# Patient Record
Sex: Male | Born: 2000 | Race: White | Hispanic: No | Marital: Single | Smoking: Current every day smoker
Health system: Southern US, Community
[De-identification: ages and names within clinical notes are randomized; demographics above are authoritative.]

---
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
## 2018-02-02 ENCOUNTER — Other Ambulatory Visit: Payer: Self-pay

## 2018-02-02 ENCOUNTER — Encounter (HOSPITAL_COMMUNITY): Payer: Self-pay | Admitting: Emergency Medicine

## 2018-02-02 ENCOUNTER — Emergency Department (HOSPITAL_COMMUNITY)
Admission: EM | Admit: 2018-02-02 | Discharge: 2018-02-03 | Disposition: A | Discharge status: Home / Self Care | Payer: Self-pay | Attending: Emergency Medicine | Admitting: Emergency Medicine

## 2018-02-02 DIAGNOSIS — R1011 Right upper quadrant pain: Secondary | ICD-10-CM | POA: Insufficient documentation

## 2018-02-02 DIAGNOSIS — Z79899 Other long term (current) drug therapy: Secondary | ICD-10-CM | POA: Insufficient documentation

## 2018-02-02 DIAGNOSIS — F17210 Nicotine dependence, cigarettes, uncomplicated: Secondary | ICD-10-CM | POA: Insufficient documentation

## 2018-02-02 DIAGNOSIS — R109 Unspecified abdominal pain: Secondary | ICD-10-CM

## 2018-02-02 LAB — LIPASE, BLOOD: Lipase: 27 U/L (ref 11–51)

## 2018-02-02 LAB — COMPREHENSIVE METABOLIC PANEL
ALBUMIN: 4.3 g/dL (ref 3.5–5.0)
ALT: 18 U/L (ref 0–44)
ANION GAP: 8 (ref 5–15)
AST: 23 U/L (ref 15–41)
Alkaline Phosphatase: 69 U/L (ref 38–126)
BILIRUBIN TOTAL: 1.2 mg/dL (ref 0.3–1.2)
BUN: 14 mg/dL (ref 6–20)
CO2: 23 mmol/L (ref 22–32)
Calcium: 9.6 mg/dL (ref 8.9–10.3)
Chloride: 102 mmol/L (ref 98–111)
Creatinine, Ser: 1.04 mg/dL (ref 0.61–1.24)
GFR calc Af Amer: >60 mL/min (ref >60)
GFR calc non Af Amer: >60 mL/min (ref >60)
GLUCOSE: 102 mg/dL — AB (ref 70–99)
POTASSIUM: 3.9 mmol/L (ref 3.5–5.1)
Sodium: 133 mmol/L — ABNORMAL LOW (ref 135–145)
TOTAL PROTEIN: 7.4 g/dL (ref 6.5–8.1)

## 2018-02-02 LAB — CBC
HEMATOCRIT: 49.4 % (ref 39.0–52.0)
HEMOGLOBIN: 17.4 g/dL — AB (ref 13.0–17.0)
MCH: 30.2 pg (ref 26.0–34.0)
MCHC: 35.2 g/dL (ref 30.0–36.0)
MCV: 85.8 fL (ref 80.0–100.0)
Platelets: 415 10*3/uL — ABNORMAL HIGH (ref 150–400)
RBC: 5.76 MIL/uL (ref 4.22–5.81)
RDW: 12.2 % (ref 11.5–15.5)
WBC: 8.7 10*3/uL (ref 4.0–10.5)
nRBC: 0 % (ref 0.0–0.2)

## 2018-02-02 NOTE — ED Provider Notes (Signed)
[facility] EMERGENCY DEPARTMENT Provider Note   CSN: [id number] Arrival date & time: 02/02/18  2137     History   Chief Complaint Chief Complaint  Patient presents with  . Abdominal Pain    HPI Jorge Santos is a 17 y.o. male.  The history is provided by the patient.  He has history of anxiety disorder and comes in complaining of abdominal pain.  Pain started yesterday morning and was located in the right upper quadrant.  Pain is relatively mild initially and has been getting worse.  Pain is currently rated at 8/10.  Pain has moved to involve the right lower quadrant and also into the suprapubic area.  Pain is worse with palpation and breathing.  It is better if he flexes at the hips.  There is been anorexia but he denies nausea or vomiting.  He feels he is constipated and last bowel movement was yesterday.  He denies fever, chills, sweats.  He has not taken anything for pain.  History reviewed. No pertinent past medical history.  Patient Active Problem List   Diagnosis Date Noted  . Respiratory distress   . SOB (shortness of breath)   . Encounter for orogastric tube placement   . Hypoxia   . Anxiety disorder 10/24/2017  . Tobacco abuse   . Community acquired pneumonia 10/18/2017    History reviewed. No pertinent surgical history.      Home Medications    Prior to Admission medications   Medication Sig Start Date End Date Taking? Authorizing Provider  acetaminophen (TYLENOL) 500 MG tablet Take 1,000 mg by mouth every 6 (six) hours as needed for mild pain.    [provider]  busPIRone (BUSPAR) 5 MG tablet Take 1 tablet (5 mg total) by mouth 2 (two) times daily. 10/26/17   Georgette Shell, MD  citalopram (CELEXA) 20 MG tablet Take 1 tablet (20 mg total) by mouth daily. 10/27/17   Georgette Shell, MD  nicotine (NICODERM CQ - DOSED IN MG/24 HR) 7 mg/24hr patch Place 1 patch (7 mg total) onto the skin daily. Patient not taking:  Reported on 10/28/2017 10/26/17   Georgette Shell, MD    Family History History reviewed. No pertinent family history.  Social History Social History   Tobacco Use  . Smoking status: Current Every Day Smoker    Packs/day: 0.50    Years: 4.00    Pack years: 2.00    Types: Cigarettes  . Smokeless tobacco: Former Systems developer    Types: Snuff  Substance Use Topics  . Alcohol use: No  . Drug use: Yes    Types: Marijuana    Comment:  Smokes about 5-6 times per week as of October 18, 2017     Allergies   Penicillins   Review of Systems Review of Systems  All other systems reviewed and are negative.    Physical Exam Updated Vital Signs BP 136/86 (BP Location: Right Arm)   Pulse 99   Temp 98.7 F (37.1 C) (Oral)   Resp 16   Ht 5\' 6"  (1.676 m)   Wt 65.8 kg   SpO2 99%   BMI 23.40 kg/m   Physical Exam  Nursing note and vitals reviewed.  17 year old male, resting comfortably and in no acute distress. Vital signs are normal. Oxygen saturation is 99%, which is normal. Head is normocephalic and atraumatic. PERRLA, EOMI. Oropharynx is clear. Neck is nontender and supple without adenopathy or JVD. Back is nontender and  there is no CVA tenderness. Lungs are clear without rales, wheezes, or rhonchi. Chest is nontender. Heart has regular rate and rhythm without murmur. Abdomen is soft, flat, With mild tenderness in the right lower quadrant and suprapubic area. There are no masses or hepatosplenomegaly and peristalsis is hypoactive. Extremities have no cyanosis or edema, full range of motion is present. Skin is warm and dry without rash. Neurologic: Mental status is normal, cranial nerves are intact, there are no motor or sensory deficits.  ED Treatments / Results  Labs (all labs ordered are listed, but only abnormal results are displayed) Labs Reviewed  COMPREHENSIVE METABOLIC PANEL - Abnormal; Notable for the following components:      Result Value   Sodium 133 (*)     Glucose, Bld 102 (*)    All other components within normal limits  CBC - Abnormal; Notable for the following components:   Hemoglobin 17.4 (*)    Platelets 415 (*)    All other components within normal limits  URINALYSIS, ROUTINE W REFLEX MICROSCOPIC - Abnormal; Notable for the following components:   Color, Urine STRAW (*)    Specific Gravity, Urine 1.003 (*)    All other components within normal limits  LIPASE, BLOOD   Radiology Ct Abdomen Pelvis W Contrast  Result Date: 02/03/2018 CLINICAL DATA:  16 year old male with right-sided abdominal pain. EXAM: CT ABDOMEN AND PELVIS WITH CONTRAST TECHNIQUE: Multidetector CT imaging of the abdomen and pelvis was performed using the standard protocol following bolus administration of intravenous contrast. CONTRAST:  146mL OMNIPAQUE IOHEXOL 300 MG/ML  SOLN COMPARISON:  CT of the abdomen pelvis dated 10/18/2017 FINDINGS: Lower chest: The visualized lung bases are clear. No intra-abdominal free air or free fluid. Hepatobiliary: No focal liver abnormality is seen. No gallstones, gallbladder wall thickening, or biliary dilatation. Pancreas: Unremarkable. No pancreatic ductal dilatation or surrounding inflammatory changes. Spleen: Normal in size without focal abnormality. Adrenals/Urinary Tract: Adrenal glands are unremarkable. Kidneys are normal, without renal calculi, focal lesion, or hydronephrosis. Bladder is unremarkable. Stomach/Bowel: There is moderate stool throughout the colon. No bowel obstruction or active inflammation. Normal appendix. Vascular/Lymphatic: No significant vascular findings are present. No enlarged abdominal or pelvic lymph nodes. Reproductive: The prostate and seminal vesicles are grossly unremarkable. No pelvic mass. Other: None Musculoskeletal: No acute or significant osseous findings. IMPRESSION: No acute intra-abdominal or pelvic pathology. No bowel obstruction or active inflammation. Normal appendix. Electronically Signed   By: Anner Crete M.D.   On: 02/03/2018 01:23    Procedures Procedures   Medications Ordered in ED Medications  dicyclomine (BENTYL) capsule 20 mg (has no administration in time range)  sodium chloride 0.9 % bolus 1,000 mL (0 mLs Intravenous Stopped 02/03/18 0127)  morphine 4 MG/ML injection 4 mg (4 mg Intravenous Given 02/03/18 0034)  iohexol (OMNIPAQUE) 300 MG/ML solution 100 mL (100 mLs Intravenous Contrast Given 02/03/18 0042)     Initial Impression / Assessment and Plan / ED Course  I have reviewed the triage vital signs and the nursing notes.  Pertinent labs & imaging results that were available during my care of the patient were reviewed by me and considered in my medical decision making (see chart for details).  Abdominal pain which seems benign on exam.  However, I am concerned that pain started in the upper abdomen is migrated to the right lower abdomen.  He will be sent for CT scan to rule out appendicitis.  Of note, screening labs are essentially normal including normal WBC.  Old  records are reviewed, and he has seen his primary care provider for GI complaints in the past.  CT is unremarkable.  Suspect underlying irritable bowel syndrome.  He is discharged with prescription for dicyclomine.  Return precautions discussed.  Final Clinical Impressions(s) / ED Diagnoses   Final diagnoses:  Abdominal pain, unspecified abdominal location    ED Discharge Orders         Ordered    dicyclomine (BENTYL) 20 MG tablet  3 times daily before meals & bedtime     02/03/18 0813           Delora Fuel, MD 88/71/95 [phone]

## 2018-02-02 NOTE — ED Triage Notes (Signed)
Pt reports RUQ pain that started yesterday and is now spread to his RLQ. Denies N/V/D

## 2018-02-03 ENCOUNTER — Emergency Department (HOSPITAL_COMMUNITY): Payer: Self-pay

## 2018-02-03 LAB — URINALYSIS, ROUTINE W REFLEX MICROSCOPIC
Bilirubin Urine: NEGATIVE
Glucose, UA: NEGATIVE mg/dL
Hgb urine dipstick: NEGATIVE
Ketones, ur: NEGATIVE mg/dL
LEUKOCYTES UA: NEGATIVE
NITRITE: NEGATIVE
Protein, ur: NEGATIVE mg/dL
SPECIFIC GRAVITY, URINE: 1.003 — AB (ref 1.005–1.030)
pH: 6 (ref 5.0–8.0)

## 2018-02-03 MED ORDER — DICYCLOMINE HCL 10 MG PO CAPS: 20.0000 mg | ORAL_CAPSULE | Freq: Once | ORAL | Status: DC

## 2018-02-03 MED ORDER — IOHEXOL 300 MG/ML  SOLN
100.0000 mL | Freq: Once | INTRAMUSCULAR | Status: AC | PRN
  Administered 2018-02-03: 100 mL via INTRAVENOUS

## 2018-02-03 MED ORDER — MORPHINE SULFATE (PF) 4 MG/ML IV SOLN
4.0000 mg | Freq: Once | INTRAVENOUS | Status: AC
  Administered 2018-02-03: 4 mg via INTRAVENOUS
  Filled 2018-02-03: qty 1

## 2018-02-03 MED ORDER — DICYCLOMINE HCL 20 MG PO TABS: 20.0000 mg | ORAL_TABLET | Freq: Three times a day (TID) | ORAL | 0 refills | Status: DC

## 2018-02-03 MED ORDER — SODIUM CHLORIDE 0.9 % IV BOLUS
1000.0000 mL | Freq: Once | INTRAVENOUS | Status: AC
  Administered 2018-02-03: 1000 mL via INTRAVENOUS

## 2018-02-03 NOTE — ED Notes (Signed)
pts pain is bettert

## 2018-02-03 NOTE — Discharge Instructions (Addendum)
Return if symptoms are getting worse. °

## 2018-02-03 NOTE — ED Notes (Signed)
To ct

## 2019-08-05 IMAGING — CT CT CHEST W/O CM
2 of 4 series · 15 of 36 positions shown, 18 images · non-contrast
Comparison: CT abdomen and pelvis October 18, 2017 and chest
radiograph October 18, 2017

CLINICAL DATA: Dyspnea for 5 days. Abdominal pain and vomiting. No
appetite. Follow up pneumonia.

EXAM:
CT CHEST WITHOUT CONTRAST
TECHNIQUE: Multidetector CT imaging of the chest was performed following the
standard protocol without IV contrast.

[Series 2: thorax · axial · 0.75mm/px · z∈[-355,-95]mm · 12 of 152 slices shown, 15 images]
[im 11/152  mediastinal]
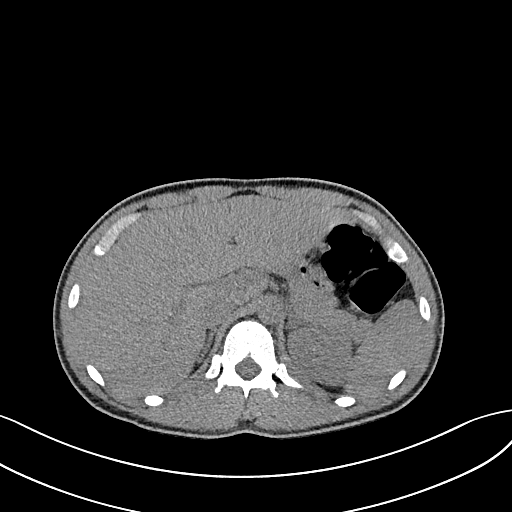
[im 11/152  lung]
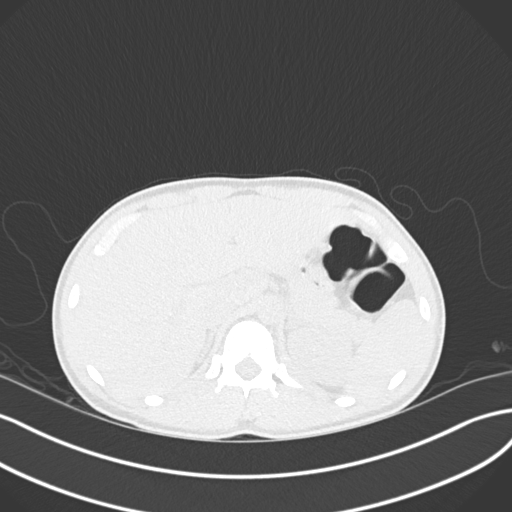
[im 22/152  lung]
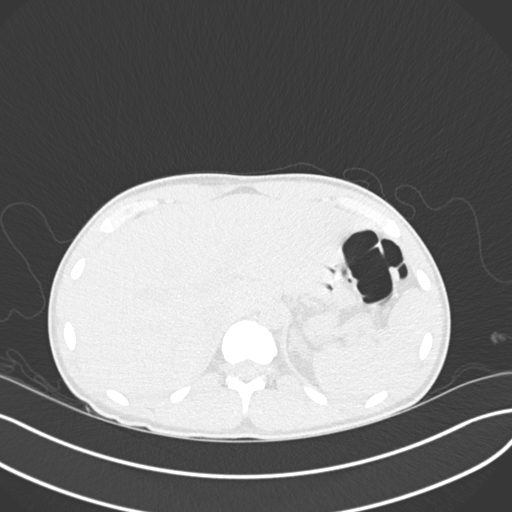
[im 33/152  lung]
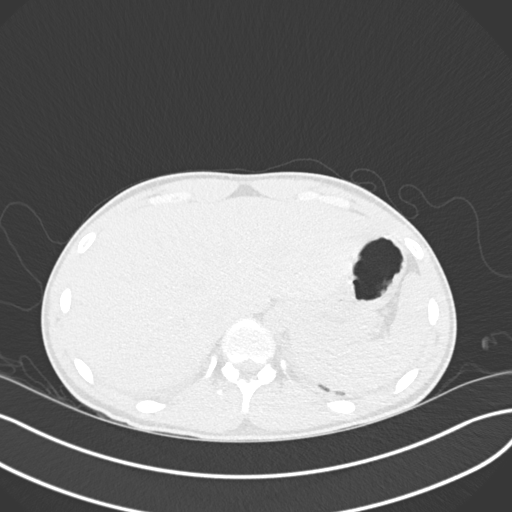
[im 44/152  lung]
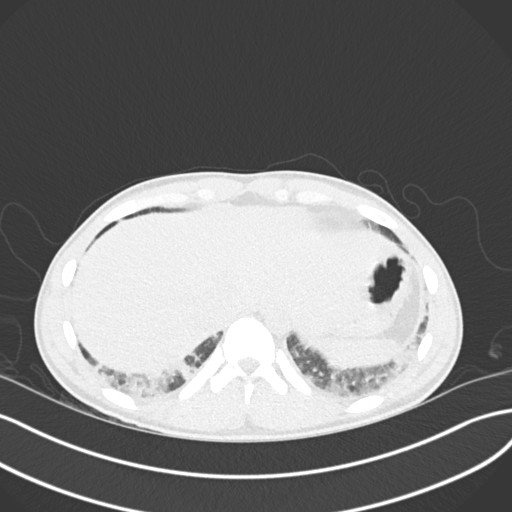
[im 54/152  mediastinal]
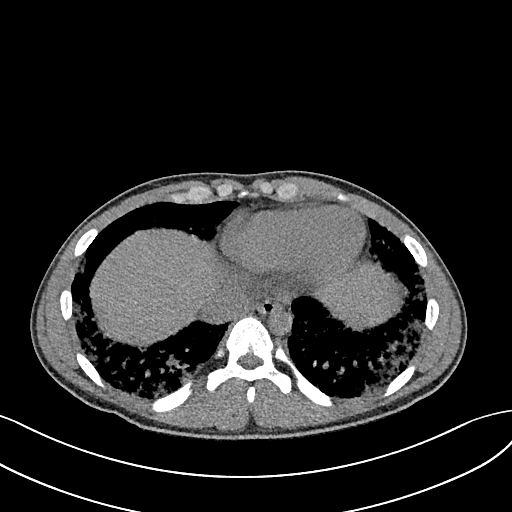
[im 54/152  lung]
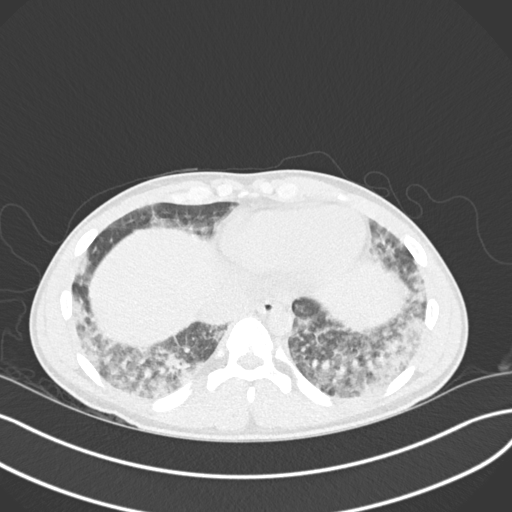
[im 65/152  lung]
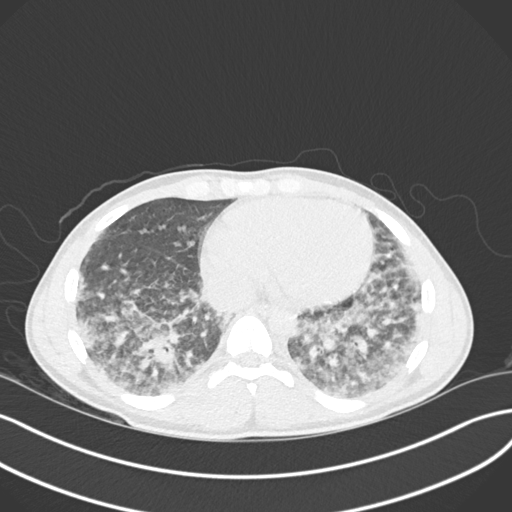
[im 87/152  lung]
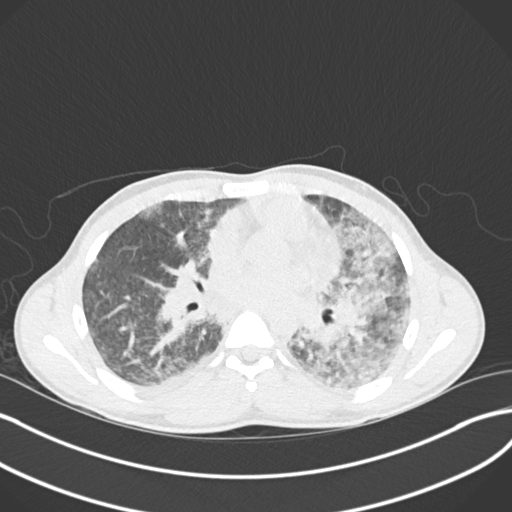
[im 98/152  lung]
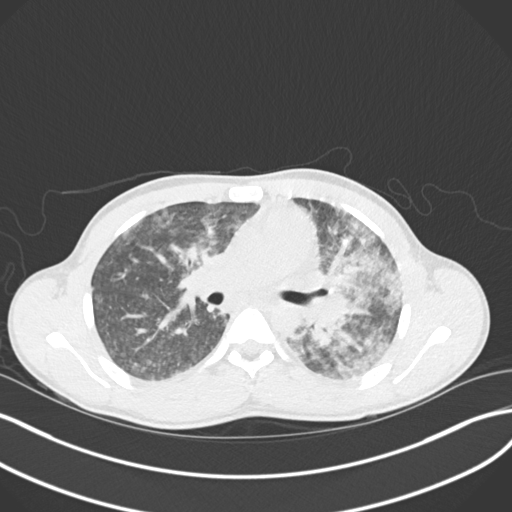
[im 108/152  mediastinal]
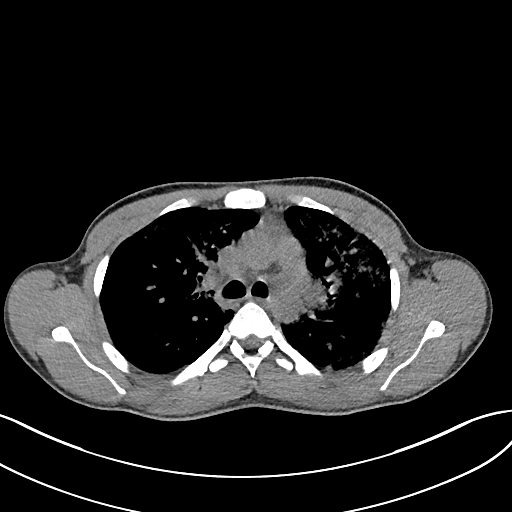
[im 108/152  lung]
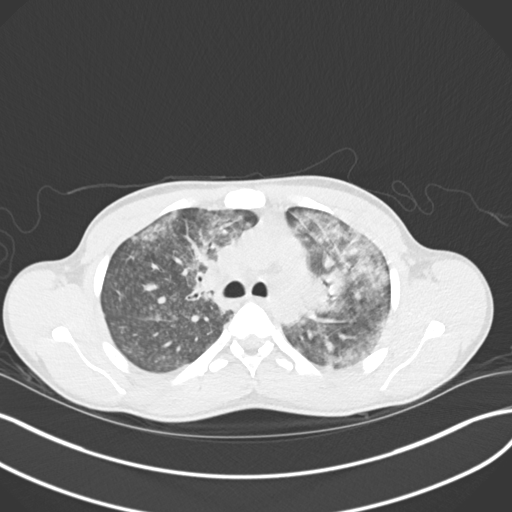
[im 119/152  lung]
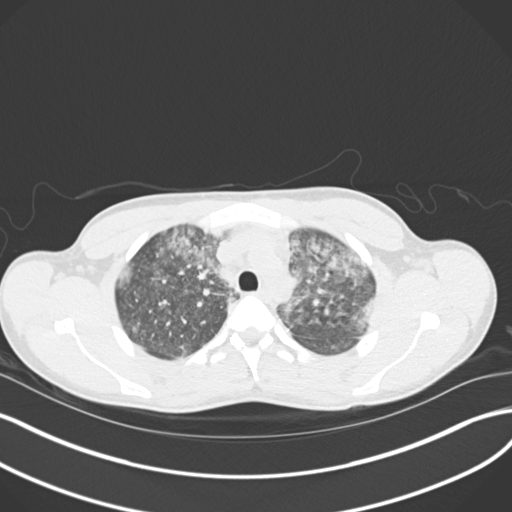
[im 130/152  lung]
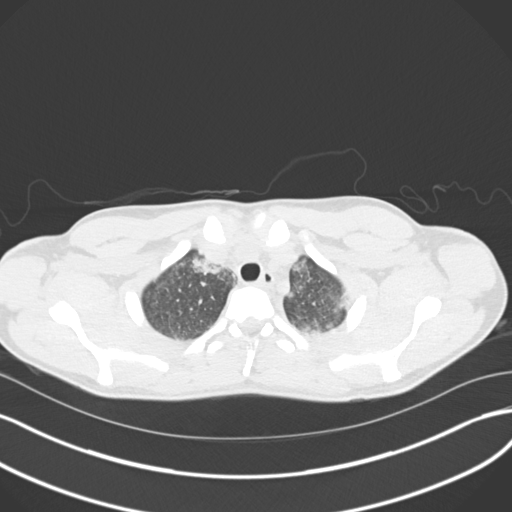
[im 141/152  lung]
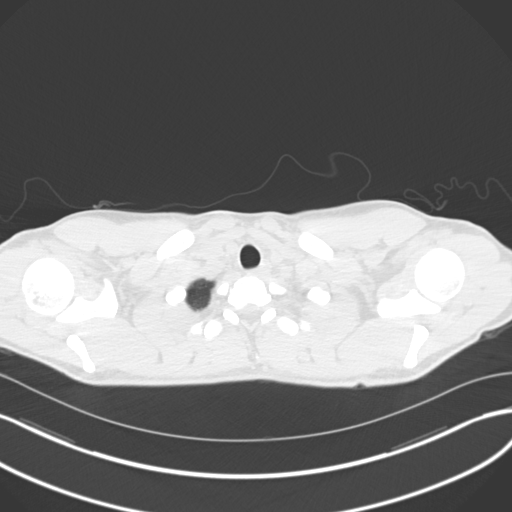

[Series 5: coronal · coronal · 0.66mm/px · 3 of 93 slices shown]
[im 19/93  lung]
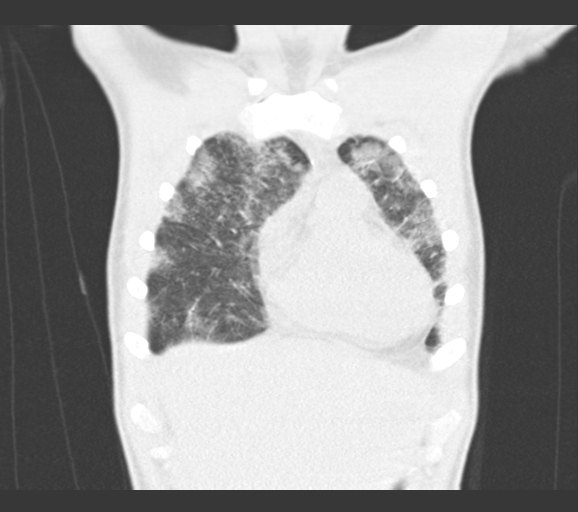
[im 37/93  lung]
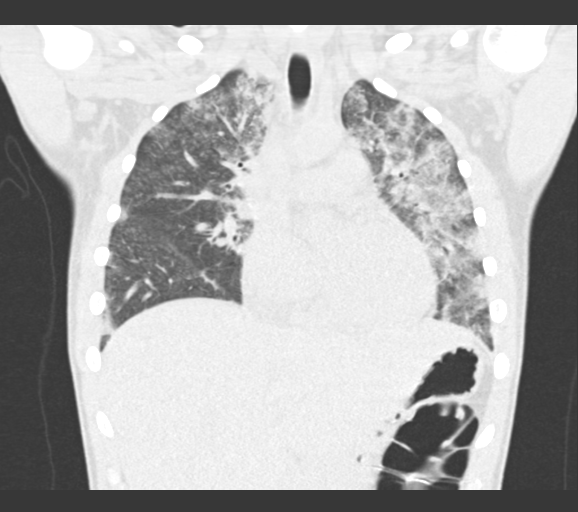
[im 56/93  lung]
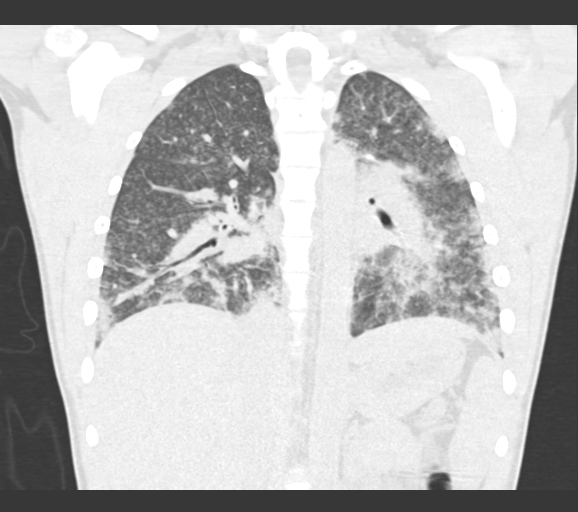

[15 of 36 positions shown; findings below may reference images not displayed]

FINDINGS: CARDIOVASCULAR: Heart and pericardium are unremarkable. Thoracic
aorta is normal course and caliber, unremarkable.

MEDIASTINUM/NODES: No mediastinal mass. Probable hilar
lymphadenopathy, decreased sensitivity without contrast. 7 mm
aortopulmonary window lymph node.

LUNGS/PLEURA: Tracheobronchial tree is patent, no pneumothorax.
Diffuse bronchial wall thickening. Extensive central lobular
ground-glass nodules with multifocal consolidation throughout the
lungs. No pleural effusion.

UPPER ABDOMEN: Nonacute.  Partially imaged contrast in the kidneys.

MUSCULOSKELETAL: Nonacute.
IMPRESSION: 1. Severe diffuse multifocal bronchopneumonia.
2. Suspected hilar lymphadenopathy, decreased sensitivity without
contrast.

## 2019-08-06 IMAGING — DX DG CHEST 1V PORT
1 series · 1 of 1 positions shown · non-contrast
Comparison: CT chest 10/18/2017. Chest x-rays 10/18/2017,
10/16/2017, 10/01/2015.

CLINICAL DATA: Respiratory distress. Follow-up BILATERAL pneumonia.

EXAM:
PORTABLE CHEST 1 VIEW

[chest ap]
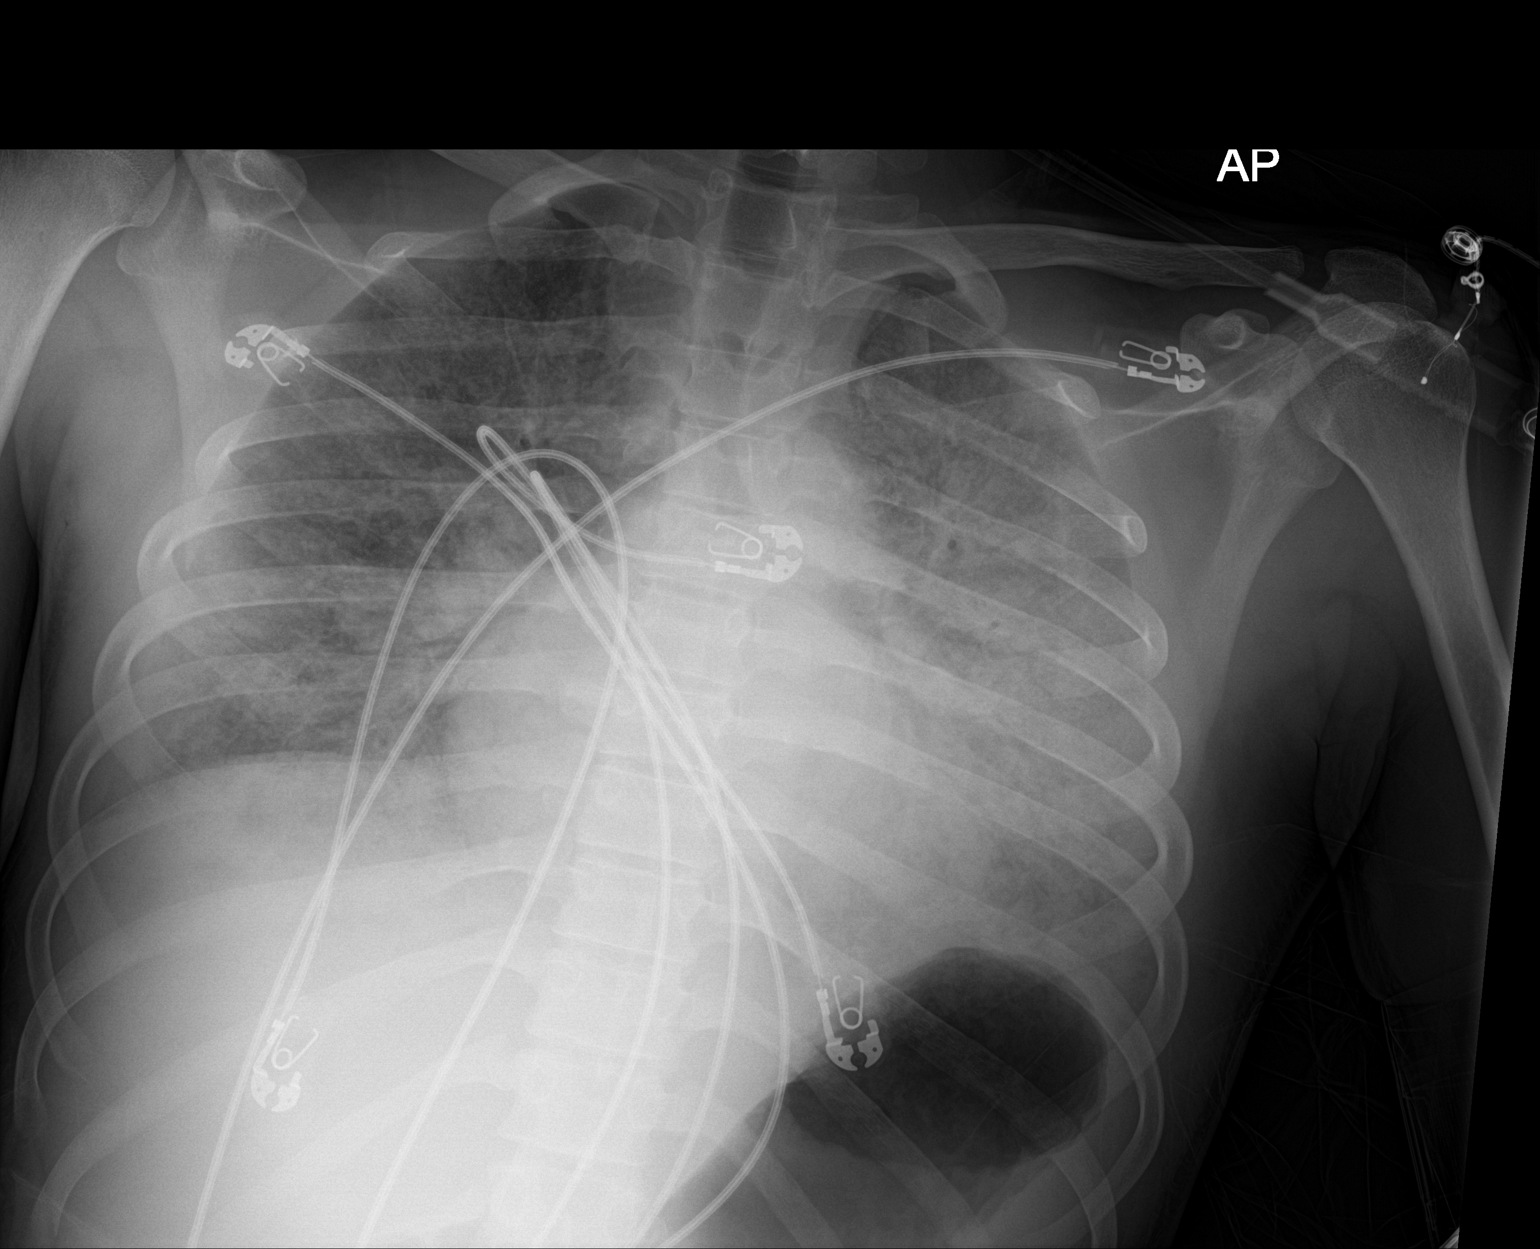

[1 of 1 positions shown; findings below may reference images not displayed]

FINDINGS: Suboptimal inspiration. Extensive airspace disease with associated
air bronchograms throughout both lungs, LEFT greater than RIGHT,
worse than on yesterday's examinations, even allowing for the degree
of inspiration.

Moderate gaseous distention of the visualized stomach.
IMPRESSION: 1. Since yesterday, interval worsening of extensive BILATERAL
pneumonia, LEFT greater than RIGHT (with possible developing ARDS).
2. Moderate gaseous distention of the stomach.

## 2019-08-08 IMAGING — DX DG CHEST 1V PORT
1 series · 1 of 1 positions shown · non-contrast
Comparison: Portable chest x-ray of 10/20/2017

CLINICAL DATA: History of pneumonia, adult respiratory distress
syndrome

EXAM:
PORTABLE CHEST 1 VIEW

[chest ap]
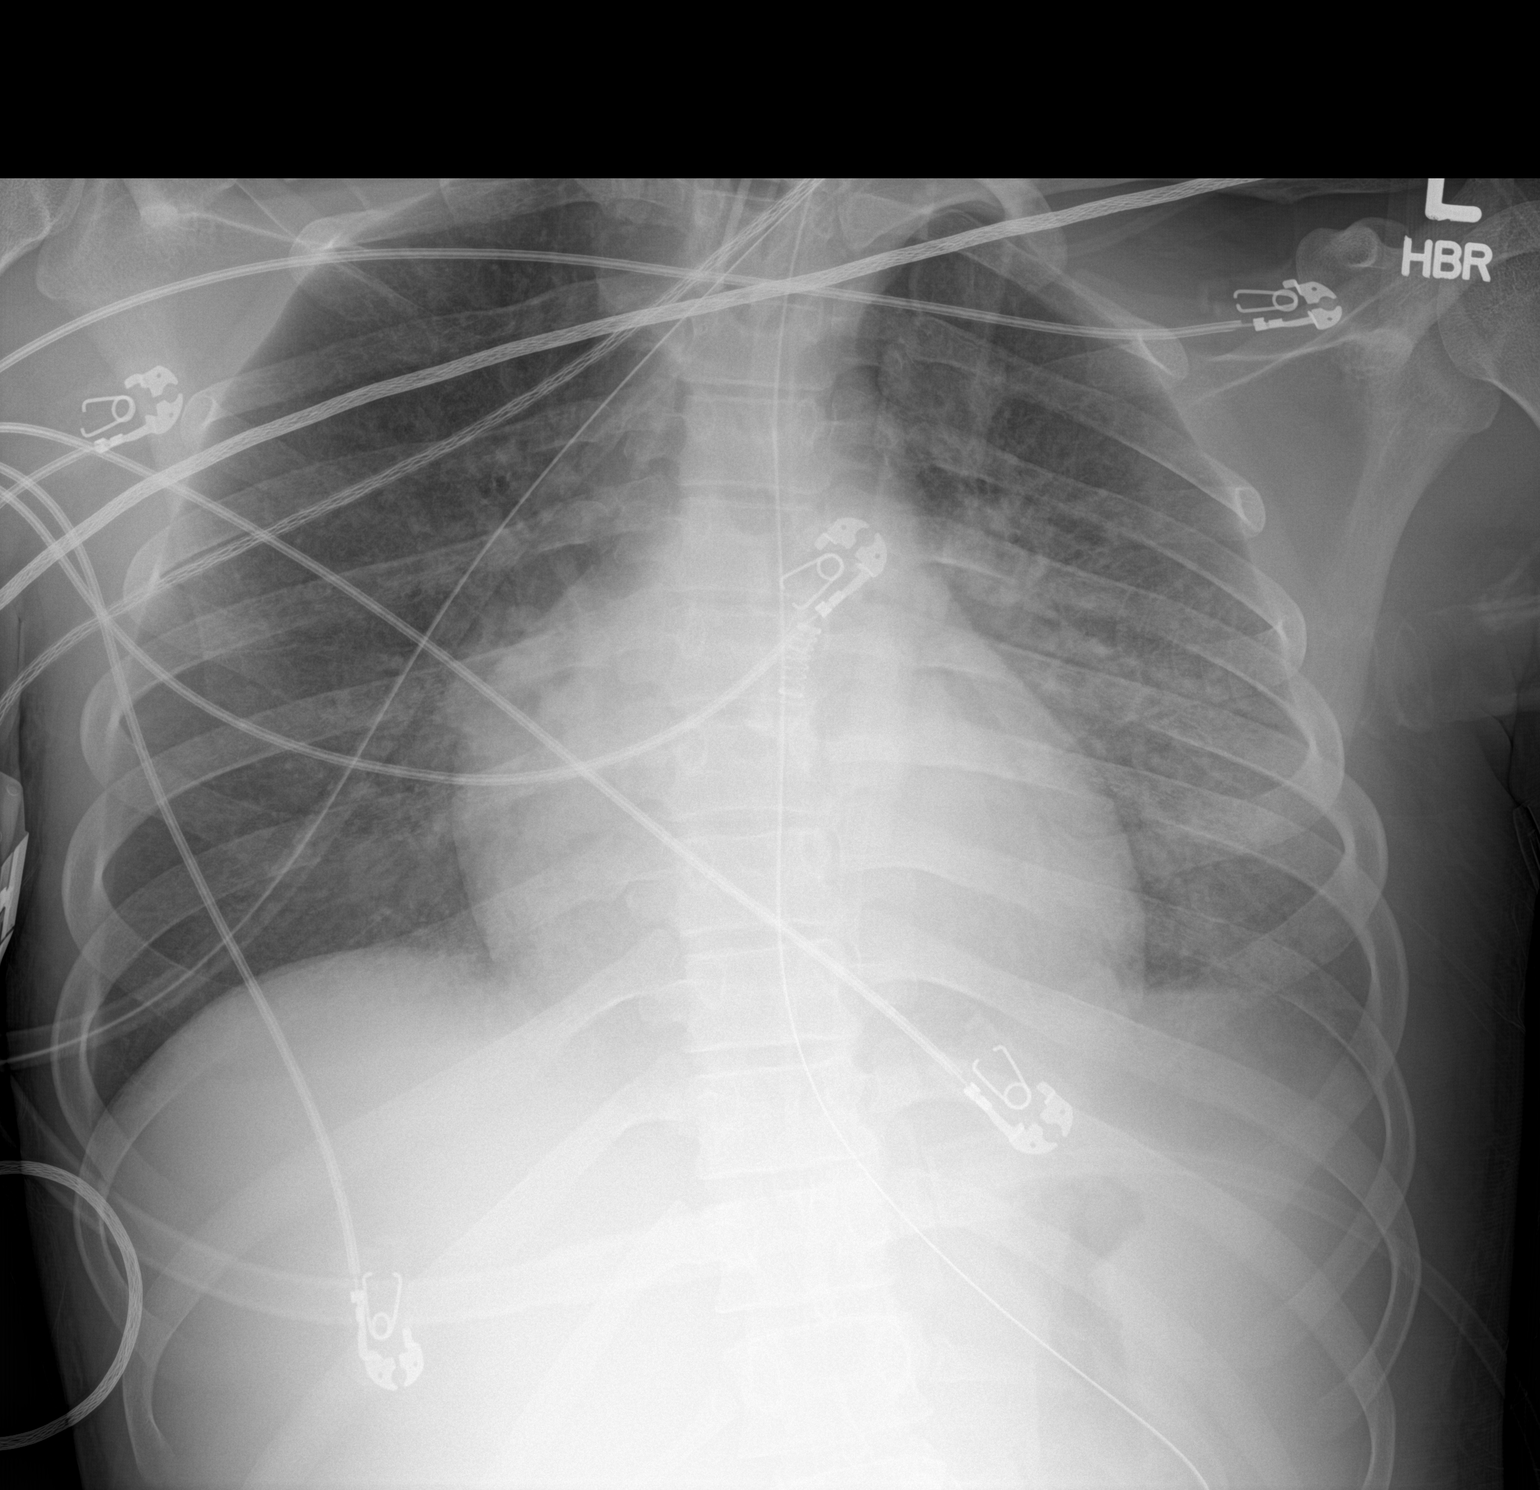

[1 of 1 positions shown; findings below may reference images not displayed]

FINDINGS: There has been some improvement in diffuse airspace disease, still
more prominent on the left. The endotracheal tube tip is
approximately 4.2 cm above the carina. NG tube extends below the
hemidiaphragm. Mild cardiomegaly is stable.
IMPRESSION: Some interval improvement in diffuse airspace disease. Tip of
endotracheal tube 4.2 cm above the carina.

## 2019-08-30 ENCOUNTER — Emergency Department (HOSPITAL_COMMUNITY): Payer: Federal, State, Local not specified - PPO

## 2019-08-30 ENCOUNTER — Other Ambulatory Visit: Payer: Self-pay

## 2019-08-30 ENCOUNTER — Encounter (HOSPITAL_COMMUNITY): Payer: Self-pay

## 2019-08-30 ENCOUNTER — Emergency Department (HOSPITAL_COMMUNITY)
Admission: EM | Admit: 2019-08-30 | Discharge: 2019-08-30 | Disposition: A | Discharge status: Home / Self Care | Payer: Federal, State, Local not specified - PPO | Attending: Emergency Medicine | Admitting: Emergency Medicine

## 2019-08-30 ENCOUNTER — Encounter (HOSPITAL_COMMUNITY): Payer: Self-pay | Admitting: Emergency Medicine

## 2019-08-30 DIAGNOSIS — R0789 Other chest pain: Secondary | ICD-10-CM | POA: Insufficient documentation

## 2019-08-30 DIAGNOSIS — Y93I9 Activity, other involving external motion: Secondary | ICD-10-CM | POA: Insufficient documentation

## 2019-08-30 DIAGNOSIS — T07XXXA Unspecified multiple injuries, initial encounter: Secondary | ICD-10-CM

## 2019-08-30 DIAGNOSIS — T1490XA Injury, unspecified, initial encounter: Secondary | ICD-10-CM

## 2019-08-30 DIAGNOSIS — S060X0A Concussion without loss of consciousness, initial encounter: Secondary | ICD-10-CM | POA: Insufficient documentation

## 2019-08-30 DIAGNOSIS — S63651A Sprain of metacarpophalangeal joint of left index finger, initial encounter: Secondary | ICD-10-CM | POA: Diagnosis not present

## 2019-08-30 DIAGNOSIS — S93402A Sprain of unspecified ligament of left ankle, initial encounter: Secondary | ICD-10-CM | POA: Diagnosis not present

## 2019-08-30 DIAGNOSIS — Z20822 Contact with and (suspected) exposure to covid-19: Secondary | ICD-10-CM | POA: Diagnosis not present

## 2019-08-30 DIAGNOSIS — Y999 Unspecified external cause status: Secondary | ICD-10-CM | POA: Insufficient documentation

## 2019-08-30 DIAGNOSIS — S6392XA Sprain of unspecified part of left wrist and hand, initial encounter: Secondary | ICD-10-CM | POA: Diagnosis not present

## 2019-08-30 DIAGNOSIS — S161XXA Strain of muscle, fascia and tendon at neck level, initial encounter: Secondary | ICD-10-CM | POA: Insufficient documentation

## 2019-08-30 DIAGNOSIS — S298XXA Other specified injuries of thorax, initial encounter: Secondary | ICD-10-CM

## 2019-08-30 DIAGNOSIS — Z23 Encounter for immunization: Secondary | ICD-10-CM | POA: Insufficient documentation

## 2019-08-30 DIAGNOSIS — Y92410 Unspecified street and highway as the place of occurrence of the external cause: Secondary | ICD-10-CM | POA: Insufficient documentation

## 2019-08-30 DIAGNOSIS — R109 Unspecified abdominal pain: Secondary | ICD-10-CM | POA: Insufficient documentation

## 2019-08-30 DIAGNOSIS — S3991XA Unspecified injury of abdomen, initial encounter: Secondary | ICD-10-CM

## 2019-08-30 LAB — PROTIME-INR
INR: 1.1 (ref 0.8–1.2)
Prothrombin Time: 13.7 seconds (ref 11.4–15.2)

## 2019-08-30 LAB — CBC
HCT: 48.7 % (ref 39.0–52.0)
Hemoglobin: 17.9 g/dL — ABNORMAL HIGH (ref 13.0–17.0)
MCH: 32.8 pg (ref 26.0–34.0)
MCHC: 36.8 g/dL — ABNORMAL HIGH (ref 30.0–36.0)
MCV: 89.2 fL (ref 80.0–100.0)
Platelets: 446 10*3/uL — ABNORMAL HIGH (ref 150–400)
RBC: 5.46 MIL/uL (ref 4.22–5.81)
RDW: 12.9 % (ref 11.5–15.5)
WBC: 7.8 10*3/uL (ref 4.0–10.5)
nRBC: 0 % (ref 0.0–0.2)

## 2019-08-30 LAB — TYPE AND SCREEN
ABO/RH(D): A POS
Antibody Screen: NEGATIVE

## 2019-08-30 LAB — COMPREHENSIVE METABOLIC PANEL
ALT: 27 U/L (ref 0–44)
AST: 43 U/L — ABNORMAL HIGH (ref 15–41)
Albumin: 4.2 g/dL (ref 3.5–5.0)
Alkaline Phosphatase: 67 U/L (ref 38–126)
Anion gap: 16 — ABNORMAL HIGH (ref 5–15)
BUN: 6 mg/dL (ref 6–20)
CO2: 18 mmol/L — ABNORMAL LOW (ref 22–32)
Calcium: 9.8 mg/dL (ref 8.9–10.3)
Chloride: 105 mmol/L (ref 98–111)
Creatinine, Ser: 1.06 mg/dL (ref 0.61–1.24)
GFR calc Af Amer: >60 mL/min (ref >60)
GFR calc non Af Amer: >60 mL/min (ref >60)
Glucose, Bld: 111 mg/dL — ABNORMAL HIGH (ref 70–99)
Potassium: 3.4 mmol/L — ABNORMAL LOW (ref 3.5–5.1)
Sodium: 139 mmol/L (ref 135–145)
Total Bilirubin: 1.3 mg/dL — ABNORMAL HIGH (ref 0.3–1.2)
Total Protein: 7.8 g/dL (ref 6.5–8.1)

## 2019-08-30 LAB — I-STAT CHEM 8, ED
BUN: 7 mg/dL (ref 6–20)
Calcium, Ion: 1.1 mmol/L — ABNORMAL LOW (ref 1.15–1.40)
Chloride: 105 mmol/L (ref 98–111)
Creatinine, Ser: 1.1 mg/dL (ref 0.61–1.24)
Glucose, Bld: 107 mg/dL — ABNORMAL HIGH (ref 70–99)
HCT: 52 % (ref 39.0–52.0)
Hemoglobin: 17.7 g/dL — ABNORMAL HIGH (ref 13.0–17.0)
Potassium: 3.3 mmol/L — ABNORMAL LOW (ref 3.5–5.1)
Sodium: 140 mmol/L (ref 135–145)
TCO2: 19 mmol/L — ABNORMAL LOW (ref 22–32)

## 2019-08-30 LAB — SARS CORONAVIRUS 2 BY RT PCR (HOSPITAL ORDER, PERFORMED IN ~~LOC~~ HOSPITAL LAB): SARS Coronavirus 2: NEGATIVE

## 2019-08-30 LAB — ETHANOL: Alcohol, Ethyl (B): 26 mg/dL — ABNORMAL HIGH (ref <10)

## 2019-08-30 LAB — ABO/RH: ABO/RH(D): A POS

## 2019-08-30 LAB — LACTIC ACID, PLASMA: Lactic Acid, Venous: 3.4 mmol/L (ref 0.5–1.9)

## 2019-08-30 MED ORDER — HYDROCODONE-ACETAMINOPHEN 5-325 MG PO TABS: 1.0000 | ORAL_TABLET | Freq: Four times a day (QID) | ORAL | 0 refills | Status: DC | PRN

## 2019-08-30 MED ORDER — ONDANSETRON HCL 4 MG/2ML IJ SOLN
4.0000 mg | Freq: Once | INTRAMUSCULAR | Status: AC
  Administered 2019-08-30: 4 mg via INTRAVENOUS

## 2019-08-30 MED ORDER — KETOROLAC TROMETHAMINE 30 MG/ML IJ SOLN
30.0000 mg | Freq: Once | INTRAMUSCULAR | Status: AC
  Administered 2019-08-30: 30 mg via INTRAVENOUS

## 2019-08-30 MED ORDER — TETANUS-DIPHTH-ACELL PERTUSSIS 5-2.5-18.5 LF-MCG/0.5 IM SUSP
0.5000 mL | Freq: Once | INTRAMUSCULAR | Status: AC
  Administered 2019-08-30: 0.5 mL via INTRAMUSCULAR
  Filled 2019-08-30: qty 0.5

## 2019-08-30 MED ORDER — LACTATED RINGERS IV BOLUS
1000.0000 mL | Freq: Once | INTRAVENOUS | Status: AC
  Administered 2019-08-30: 1000 mL via INTRAVENOUS

## 2019-08-30 MED ORDER — IBUPROFEN 400 MG PO TABS
400.0000 mg | ORAL_TABLET | Freq: Three times a day (TID) | ORAL | 0 refills | Status: DC | PRN
Start: 2019-08-30 — End: 2020-05-24

## 2019-08-30 MED ORDER — IOHEXOL 300 MG/ML  SOLN
100.0000 mL | Freq: Once | INTRAMUSCULAR | Status: AC | PRN
  Administered 2019-08-30: 100 mL via INTRAVENOUS
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
## 2019-08-30 NOTE — ED Notes (Signed)
Pt to CT
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
## 2019-08-30 NOTE — ED Notes (Signed)
Patient given urinal.

## 2019-08-30 NOTE — ED Notes (Signed)
Discharge instructions discussed with pt. Pt verbalized understanding. Pt stable and ambulatory. No signature pad available. 

## 2019-08-30 NOTE — ED Notes (Signed)
pts mother is waiting outside, would like an update when possible [phone]

## 2019-08-30 NOTE — ED Notes (Signed)
Pt ambulated in room with crutches and ASO splint on L foot

## 2019-08-30 NOTE — ED Notes (Signed)
Patient stood to ambulated and screamed out in pain. States left ankle feels broken. Feels tight pressure like its going to pop when trying to apply full pressure. Moved patient back in bed. MD notified.

## 2019-09-02 ENCOUNTER — Other Ambulatory Visit: Payer: Self-pay

## 2019-09-02 ENCOUNTER — Encounter (HOSPITAL_COMMUNITY): Payer: Self-pay | Admitting: Emergency Medicine

## 2019-09-02 ENCOUNTER — Emergency Department (HOSPITAL_COMMUNITY)
Admission: EM | Admit: 2019-09-02 | Discharge: 2019-09-02 | Disposition: A | Discharge status: Home / Self Care | Payer: Federal, State, Local not specified - PPO | Attending: Emergency Medicine | Admitting: Emergency Medicine

## 2019-09-02 ENCOUNTER — Emergency Department (HOSPITAL_COMMUNITY): Payer: Federal, State, Local not specified - PPO

## 2019-09-02 DIAGNOSIS — M546 Pain in thoracic spine: Secondary | ICD-10-CM | POA: Diagnosis not present

## 2019-09-02 DIAGNOSIS — M549 Dorsalgia, unspecified: Secondary | ICD-10-CM | POA: Diagnosis present

## 2019-09-02 DIAGNOSIS — Y929 Unspecified place or not applicable: Secondary | ICD-10-CM | POA: Diagnosis not present

## 2019-09-02 DIAGNOSIS — Y999 Unspecified external cause status: Secondary | ICD-10-CM | POA: Insufficient documentation

## 2019-09-02 DIAGNOSIS — F17210 Nicotine dependence, cigarettes, uncomplicated: Secondary | ICD-10-CM | POA: Diagnosis not present

## 2019-09-02 DIAGNOSIS — R030 Elevated blood-pressure reading, without diagnosis of hypertension: Secondary | ICD-10-CM | POA: Diagnosis not present

## 2019-09-02 DIAGNOSIS — Z79899 Other long term (current) drug therapy: Secondary | ICD-10-CM | POA: Diagnosis not present

## 2019-09-02 DIAGNOSIS — X500XXA Overexertion from strenuous movement or load, initial encounter: Secondary | ICD-10-CM | POA: Insufficient documentation

## 2019-09-02 DIAGNOSIS — Y9389 Activity, other specified: Secondary | ICD-10-CM | POA: Insufficient documentation

## 2019-09-02 MED ORDER — CYCLOBENZAPRINE HCL 10 MG PO TABS: 10.0000 mg | ORAL_TABLET | Freq: Every day | ORAL | 0 refills | Status: DC
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
## 2019-09-02 NOTE — Discharge Instructions (Addendum)
Per our discussion, I am prescribing you a muscle relaxant.  It is called Flexeril.  Please only take this at night with dinner.  It is sedating so do not mix with alcohol or operate a motor vehicle after taking it.  I understand the still have hydrocodone left from her prior prescription, please do not combine these medications.  You can continue to take ibuprofen for management of your pain throughout the day.  If your ankle symptoms do not begin to alleviate in the next week I would recommend you follow-up with orthopedics.  Continue with walking and range of motion exercises as your pain tolerates.  I would recommend as much movement as you can handle secondary to your pain.  Feel free to apply topical anti-inflammatories like Voltaren gel or ice/heat to the regions that are bothering you.  If your symptoms worsen please do not hesitate to return to the emergency department for reevaluation.  It was a pleasure to meet you.

## 2019-09-08 ENCOUNTER — Other Ambulatory Visit: Payer: Self-pay

## 2019-09-08 ENCOUNTER — Ambulatory Visit (INDEPENDENT_AMBULATORY_CARE_PROVIDER_SITE_OTHER): Payer: Federal, State, Local not specified - PPO | Admitting: Family Medicine

## 2019-09-08 ENCOUNTER — Encounter: Payer: Self-pay | Admitting: Family Medicine

## 2019-09-08 DIAGNOSIS — M25572 Pain in left ankle and joints of left foot: Secondary | ICD-10-CM | POA: Diagnosis not present

## 2019-09-08 DIAGNOSIS — R0781 Pleurodynia: Secondary | ICD-10-CM | POA: Diagnosis not present

## 2019-09-08 MED ORDER — NABUMETONE 750 MG PO TABS: 750.0000 mg | ORAL_TABLET | Freq: Two times a day (BID) | ORAL | 6 refills | Status: DC | PRN

## 2019-09-08 MED ORDER — BACLOFEN 10 MG PO TABS: 5.0000 mg | ORAL_TABLET | Freq: Three times a day (TID) | ORAL | 3 refills | Status: DC | PRN
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
## 2019-09-08 NOTE — Patient Instructions (Signed)
    Try a rib belt from the pharmacy.   Start physical therapy for the ankle sprain.  Return in 2-3 weeks if not getting better.

## 2019-10-10 ENCOUNTER — Other Ambulatory Visit: Payer: Self-pay

## 2019-10-10 ENCOUNTER — Ambulatory Visit: Payer: Federal, State, Local not specified - PPO | Admitting: Family Medicine

## 2019-10-10 ENCOUNTER — Ambulatory Visit: Payer: Self-pay

## 2019-10-10 ENCOUNTER — Encounter: Payer: Self-pay | Admitting: Family Medicine

## 2019-10-10 DIAGNOSIS — M25572 Pain in left ankle and joints of left foot: Secondary | ICD-10-CM | POA: Diagnosis not present

## 2019-10-10 DIAGNOSIS — R0781 Pleurodynia: Secondary | ICD-10-CM | POA: Diagnosis not present
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
## 2019-10-10 NOTE — Patient Instructions (Signed)
   YouTube:  Physical therapy exercises for ankle sprain

## 2020-05-24 ENCOUNTER — Encounter (HOSPITAL_COMMUNITY): Payer: Self-pay | Admitting: Emergency Medicine

## 2020-05-24 ENCOUNTER — Ambulatory Visit (HOSPITAL_COMMUNITY)
Admission: EM | Admit: 2020-05-24 | Discharge: 2020-05-24 | Disposition: A | Discharge status: Home / Self Care | Payer: No Payment, Other | Attending: Urology | Admitting: Urology

## 2020-05-24 ENCOUNTER — Other Ambulatory Visit: Payer: Self-pay

## 2020-05-24 DIAGNOSIS — F17210 Nicotine dependence, cigarettes, uncomplicated: Secondary | ICD-10-CM | POA: Insufficient documentation

## 2020-05-24 DIAGNOSIS — R454 Irritability and anger: Secondary | ICD-10-CM | POA: Insufficient documentation

## 2020-05-24 DIAGNOSIS — R4587 Impulsiveness: Secondary | ICD-10-CM | POA: Insufficient documentation

## 2020-05-24 DIAGNOSIS — F411 Generalized anxiety disorder: Secondary | ICD-10-CM | POA: Insufficient documentation

## 2020-05-24 DIAGNOSIS — F1290 Cannabis use, unspecified, uncomplicated: Secondary | ICD-10-CM | POA: Insufficient documentation

## 2020-05-24 DIAGNOSIS — F22 Delusional disorders: Secondary | ICD-10-CM

## 2020-05-24 DIAGNOSIS — Z20822 Contact with and (suspected) exposure to covid-19: Secondary | ICD-10-CM | POA: Insufficient documentation

## 2020-05-24 DIAGNOSIS — Z79899 Other long term (current) drug therapy: Secondary | ICD-10-CM | POA: Insufficient documentation

## 2020-05-24 DIAGNOSIS — Z560 Unemployment, unspecified: Secondary | ICD-10-CM | POA: Insufficient documentation

## 2020-05-24 DIAGNOSIS — F333 Major depressive disorder, recurrent, severe with psychotic symptoms: Secondary | ICD-10-CM | POA: Insufficient documentation

## 2020-05-24 DIAGNOSIS — F331 Major depressive disorder, recurrent, moderate: Secondary | ICD-10-CM

## 2020-05-24 LAB — POCT URINE DRUG SCREEN - MANUAL ENTRY (I-SCREEN)
POC Amphetamine UR: NOT DETECTED
POC Buprenorphine (BUP): NOT DETECTED
POC Cocaine UR: NOT DETECTED
POC Marijuana UR: POSITIVE — AB
POC Methadone UR: NOT DETECTED
POC Methamphetamine UR: NOT DETECTED
POC Morphine: NOT DETECTED
POC Oxazepam (BZO): NOT DETECTED
POC Oxycodone UR: NOT DETECTED
POC Secobarbital (BAR): NOT DETECTED

## 2020-05-24 LAB — CBC WITH DIFFERENTIAL/PLATELET
Abs Immature Granulocytes: 0.03 10*3/uL (ref 0.00–0.07)
Basophils Absolute: 0.1 10*3/uL (ref 0.0–0.1)
Basophils Relative: 1 %
Eosinophils Absolute: 0.4 10*3/uL (ref 0.0–0.5)
Eosinophils Relative: 7 %
HCT: 47.1 % (ref 39.0–52.0)
Hemoglobin: 17.2 g/dL — ABNORMAL HIGH (ref 13.0–17.0)
Immature Granulocytes: 1 %
Lymphocytes Relative: 35 %
Lymphs Abs: 2.2 10*3/uL (ref 0.7–4.0)
MCH: 32.6 pg (ref 26.0–34.0)
MCHC: 36.5 g/dL — ABNORMAL HIGH (ref 30.0–36.0)
MCV: 89.2 fL (ref 80.0–100.0)
Monocytes Absolute: 0.6 10*3/uL (ref 0.1–1.0)
Monocytes Relative: 9 %
Neutro Abs: 2.9 10*3/uL (ref 1.7–7.7)
Neutrophils Relative %: 47 %
Platelets: 384 10*3/uL (ref 150–400)
RBC: 5.28 MIL/uL (ref 4.22–5.81)
RDW: 12.3 % (ref 11.5–15.5)
WBC: 6.1 10*3/uL (ref 4.0–10.5)
nRBC: 0 % (ref 0.0–0.2)

## 2020-05-24 LAB — RESP PANEL BY RT-PCR (FLU A&B, COVID) ARPGX2
Influenza A by PCR: NEGATIVE
Influenza B by PCR: NEGATIVE
SARS Coronavirus 2 by RT PCR: NEGATIVE

## 2020-05-24 LAB — TSH: TSH: 1.038 u[IU]/mL (ref 0.350–4.500)

## 2020-05-24 LAB — COMPREHENSIVE METABOLIC PANEL
ALT: 25 U/L (ref 0–44)
AST: 26 U/L (ref 15–41)
Albumin: 3.8 g/dL (ref 3.5–5.0)
Alkaline Phosphatase: 59 U/L (ref 38–126)
Anion gap: 13 (ref 5–15)
BUN: 10 mg/dL (ref 6–20)
CO2: 21 mmol/L — ABNORMAL LOW (ref 22–32)
Calcium: 9.6 mg/dL (ref 8.9–10.3)
Chloride: 107 mmol/L (ref 98–111)
Creatinine, Ser: 0.84 mg/dL (ref 0.61–1.24)
GFR, Estimated: >60 mL/min (ref >60)
Glucose, Bld: 94 mg/dL (ref 70–99)
Potassium: 3.7 mmol/L (ref 3.5–5.1)
Sodium: 141 mmol/L (ref 135–145)
Total Bilirubin: 0.5 mg/dL (ref 0.3–1.2)
Total Protein: 7.2 g/dL (ref 6.5–8.1)

## 2020-05-24 LAB — LIPID PANEL
Cholesterol: 216 mg/dL — ABNORMAL HIGH (ref 0–200)
HDL: 55 mg/dL (ref >40)
LDL Cholesterol: UNDETERMINED mg/dL (ref 0–99)
Total CHOL/HDL Ratio: 3.9 RATIO
Triglycerides: 413 mg/dL — ABNORMAL HIGH (ref <150)
VLDL: UNDETERMINED mg/dL (ref 0–40)

## 2020-05-24 LAB — HEMOGLOBIN A1C
Hgb A1c MFr Bld: 5.3 % (ref 4.8–5.6)
Mean Plasma Glucose: 105.41 mg/dL

## 2020-05-24 LAB — LDL CHOLESTEROL, DIRECT: Direct LDL: 122.8 mg/dL — ABNORMAL HIGH (ref 0–99)

## 2020-05-24 LAB — POC SARS CORONAVIRUS 2 AG -  ED: SARS Coronavirus 2 Ag: NEGATIVE

## 2020-05-24 MED ORDER — HYDROXYZINE HCL 25 MG PO TABS
25.0000 mg | ORAL_TABLET | Freq: Three times a day (TID) | ORAL | Status: DC | PRN
  Administered 2020-05-24: 25 mg via ORAL
  Filled 2020-05-24: qty 1

## 2020-05-24 MED ORDER — BUSPIRONE HCL 5 MG PO TABS: 5.0000 mg | ORAL_TABLET | Freq: Two times a day (BID) | ORAL | 0 refills | Status: DC

## 2020-05-24 MED ORDER — ONDANSETRON 4 MG PO TBDP: 4.0000 mg | ORAL_TABLET | Freq: Four times a day (QID) | ORAL | Status: DC | PRN

## 2020-05-24 MED ORDER — THIAMINE HCL 100 MG PO TABS: 100.0000 mg | ORAL_TABLET | Freq: Every day | ORAL | Status: DC

## 2020-05-24 MED ORDER — TRAZODONE HCL 50 MG PO TABS: 50.0000 mg | ORAL_TABLET | Freq: Every evening | ORAL | 0 refills | Status: DC | PRN

## 2020-05-24 MED ORDER — HYDROXYZINE HCL 25 MG PO TABS: 25.0000 mg | ORAL_TABLET | Freq: Four times a day (QID) | ORAL | Status: DC | PRN

## 2020-05-24 MED ORDER — CITALOPRAM HYDROBROMIDE 20 MG PO TABS: 20.0000 mg | ORAL_TABLET | Freq: Every day | ORAL | 0 refills | Status: DC

## 2020-05-24 MED ORDER — TRAZODONE HCL 50 MG PO TABS
50.0000 mg | ORAL_TABLET | Freq: Every evening | ORAL | Status: DC | PRN
  Administered 2020-05-24: 50 mg via ORAL
  Filled 2020-05-24: qty 1

## 2020-05-24 MED ORDER — ACETAMINOPHEN 325 MG PO TABS: 650.0000 mg | ORAL_TABLET | Freq: Four times a day (QID) | ORAL | Status: DC | PRN

## 2020-05-24 MED ORDER — BUSPIRONE HCL 5 MG PO TABS
5.0000 mg | ORAL_TABLET | Freq: Two times a day (BID) | ORAL | Status: DC
  Administered 2020-05-24: 5 mg via ORAL
  Filled 2020-05-24: qty 1

## 2020-05-24 MED ORDER — HYDROXYZINE HCL 25 MG PO TABS: 25.0000 mg | ORAL_TABLET | Freq: Four times a day (QID) | ORAL | 0 refills | Status: DC | PRN

## 2020-05-24 MED ORDER — LOPERAMIDE HCL 2 MG PO CAPS: 2.0000 mg | ORAL_CAPSULE | ORAL | Status: DC | PRN

## 2020-05-24 MED ORDER — MAGNESIUM HYDROXIDE 400 MG/5ML PO SUSP: 30.0000 mL | Freq: Every day | ORAL | Status: DC | PRN

## 2020-05-24 MED ORDER — ADULT MULTIVITAMIN W/MINERALS CH
1.0000 | ORAL_TABLET | Freq: Every day | ORAL | Status: DC
  Administered 2020-05-24: 1 via ORAL
  Filled 2020-05-24: qty 1

## 2020-05-24 MED ORDER — CITALOPRAM HYDROBROMIDE 10 MG PO TABS
10.0000 mg | ORAL_TABLET | Freq: Every day | ORAL | Status: DC
  Administered 2020-05-24: 10 mg via ORAL
  Filled 2020-05-24: qty 1

## 2020-05-24 MED ORDER — LORAZEPAM 1 MG PO TABS: 1.0000 mg | ORAL_TABLET | Freq: Four times a day (QID) | ORAL | Status: DC | PRN

## 2020-05-24 NOTE — ED Notes (Signed)
Patient arrive to the FOR ASSESSMENT, Jorge Santos

## 2020-05-24 NOTE — ED Provider Notes (Addendum)
Behavioral Health Admission [facility]&P [facility] & OBS)  Date: 05/24/20 Patient Name: Jorge Santos MRN: [id number] Chief Complaint: No chief complaint on file.     Diagnoses:  Final diagnoses:  None    [id number] Jorge Santos is a 20 y/o male with past psychiatric history of DMDD, ODD, and generalized anxiety disorder. Patient presented voluntarily to [facility] with complain of worsening paranoia. Patient is alert and orient X4. Patient is tearful/crying, his speech is clear, coherent and normal rate. He reports "I'm paranoid all the time, I'm paranoid from the time I wake up til I fall asleep. I can't take it anymore." He report that he was drinking beer earlier today to "ease my mind" and felt unsafe so he called 911 to seek help. He admits to drinking about 12 cans of beers prior to calling 911. He states "I felt like giving up."   He reports that he is unable to sleep due to paranoia. He states "I feel like people are out to get me and I feel like I'm been watched all the time." He reports that his paranoid feelings are becoming too intrusive. He reports that he is unable to maintain employment due to "feeling like someone is after me at my job." He also reports that he doesn't leave his home often due to paranoia. He states "I took my daughter to the park the other day and I had to leave quickly because I felt like everyone was judging me and someone was after me."   He reports that he lives at home with his mother; he has a 40years old daughter that comes to visit him. He reports that he has a high school diploma and he is currently unemployed. Patient endorses alcohol abuse; he reports that he drinks beers daily. He states "I drink alcohol to relax and ease my mind from the paranoia." patient denies a history of seizures or seizures related alcohol withdraw. He endorses Marijuana use, he denies using any other substances.   Patient reports that he feels depressed. He endorses depressive symptoms such as  restless sleep, poor apetite, irritability, isolation, hopelessness, and loss of pleasure in activity.  He reports that he has not taken his psychiatric medication for over 3 years. He refused to state his reasons for stopping these medications.    Patient denies suicidal and homicidal ideations. He denies auditory hallucination, he endorses visual hallucination of "seeing shadows following me." he endorse paranoia.    PHQ 2-9:  [employer] Visit from 11/10/2017 in [place]  Thoughts that you would be better off dead, or of hurting yourself in some way Not at all  PHQ-9 Total Score 11        Total Time spent with patient: 30 minutes  Musculoskeletal  Strength & Muscle Tone: within normal limits Gait & Station: normal Patient leans: Right  Psychiatric Specialty Exam  Presentation General Appearance: Appropriate for Environment  Eye Contact:Good  Speech:Clear and Coherent; Normal Rate  Speech Volume:Normal  Handedness:Right   Mood and Affect  Mood:Depressed  Affect:Tearful; Depressed   Thought Process  Thought Processes:Coherent  Descriptions of Associations:Intact  Orientation:Full (Time, Place and Person)  Thought Content:WDL  Hallucinations:Hallucinations: Visual Description of Visual Hallucinations: patient reports that he sees shadows and images  Ideas of Reference:Paranoia  Suicidal Thoughts:Suicidal Thoughts: No  Homicidal Thoughts:Homicidal Thoughts: No   Sensorium  Memory:Immediate Good; Remote Good; Recent Good  Judgment:Good  Insight:Good   Executive Functions  Concentration:Good  Attention Span:Good  Recall:Good  Fund of Knowledge:Good  Language:Good   Psychomotor Activity  Psychomotor Activity:Psychomotor Activity: Normal   Assets  Assets:Communication Skills; Desire for Improvement; Housing; Physical Health; Transportation   Sleep  Sleep:Sleep: Poor   Physical Exam Vitals and  nursing note reviewed.  Constitutional:      General: He is not in acute distress.    Appearance: He is well-developed and well-nourished. He is not ill-appearing, toxic-appearing or diaphoretic.  HENT:     Head: Normocephalic and atraumatic.  Eyes:     Conjunctiva/sclera: Conjunctivae normal.  Cardiovascular:     Rate and Rhythm: Normal rate.  Pulmonary:     Effort: Pulmonary effort is normal. No respiratory distress.     Breath sounds: Normal breath sounds.  Abdominal:     Palpations: Abdomen is soft.  Musculoskeletal:        General: No edema.     Cervical back: Normal range of motion.  Skin:    Coloration: Skin is not jaundiced.  Neurological:     Mental Status: He is alert and oriented to person, place, and time.  Psychiatric:        Attention and Perception: He is attentive. He perceives visual hallucinations. He does not perceive auditory hallucinations.        Mood and Affect: Mood is anxious and depressed.        Speech: Speech is not tangential.        Behavior: Behavior normal.        Thought Content: Thought content is paranoid.        Cognition and Memory: Cognition normal.        Judgment: Judgment normal.    Review of Systems  Constitutional: Negative for chills, fever, malaise/fatigue and weight loss.  HENT: Negative for ear discharge and ear pain.   Eyes: Negative for pain and discharge.  Respiratory: Negative for cough, hemoptysis, sputum production and shortness of breath.   Cardiovascular: Negative for chest pain and palpitations.  Gastrointestinal: Negative for nausea and vomiting.  Skin: Negative for itching.  Neurological: Negative for seizures and loss of consciousness.  Psychiatric/Behavioral: Positive for depression, hallucinations and substance abuse. Negative for suicidal ideas. The patient is nervous/anxious.     Blood pressure 135/87, pulse (!) 110, temperature 99.2 F (37.3 C), temperature source Temporal, resp. rate 18, height 5\' 6"  (1.676  m), weight 160 lb (72.6 kg), SpO2 97 %. Body mass index is 25.82 kg/m.  Past Psychiatric History: ODD, DMDD, GAD   Is the patient at risk to self? No  Has the patient been a risk to self in the past 6 months? No .    Has the patient been a risk to self within the distant past? No   Is the patient a risk to others? No   Has the patient been a risk to others in the past 6 months? No   Has the patient been a risk to others within the distant past? No   Past Medical History: No past medical history on file. No past surgical history on file.  Family History: No family history on file.  Social History:  Social History   Socioeconomic History  . Marital status: Single    Spouse name: Not on file  . Number of children: Not on file  . Years of education: Not on file  . Highest education level: Not on file  Occupational History  . Not on file  Tobacco Use  . Smoking status: Current Every Day Smoker  Packs/day: 0.50    Years: 4.00    Pack years: 2.00    Types: Cigarettes  . Smokeless tobacco: Former Systems developer    Types: Snuff  Vaping Use  . Vaping Use: Never used  Substance and Sexual Activity  . Alcohol use: No  . Drug use: Yes    Types: Marijuana    Comment:  Smokes about 5-6 times per week as of October 18, 2017  . Sexual activity: Yes  Other Topics Concern  . Not on file  Social History Narrative   ** Merged History Encounter **       Social Determinants of Health   Financial Resource Strain: Not on file  Food Insecurity: Not on file  Transportation Needs: Not on file  Physical Activity: Not on file  Stress: Not on file  Social Connections: Not on file  Intimate Partner Violence: Not on file    SDOH:  SDOH Screenings   Alcohol Screen: Not on file  Depression (WUJ8-1): Not on file  Financial Resource Strain: Not on file  Food Insecurity: Not on file  Housing: Not on file  Physical Activity: Not on file  Social Connections: Not on file  Stress: Not on file   Tobacco Use: High Risk  . Smoking Tobacco Use: Current Every Day Smoker  . Smokeless Tobacco Use: Former Soil scientist Needs: Not on file    Last Labs:  Admission on 05/24/2020  Component Date Value Ref Range Status  . SARS Coronavirus 2 Ag 05/24/2020 Negative  Negative Preliminary  . POC Amphetamine UR 05/24/2020 None Detected  NONE DETECTED (Cut Off Level 1000 ng/mL) Preliminary  . POC Secobarbital (BAR) 05/24/2020 None Detected  NONE DETECTED (Cut Off Level 300 ng/mL) Preliminary  . POC Buprenorphine (BUP) 05/24/2020 None Detected  NONE DETECTED (Cut Off Level 10 ng/mL) Preliminary  . POC Oxazepam (BZO) 05/24/2020 None Detected  NONE DETECTED (Cut Off Level 300 ng/mL) Preliminary  . POC Cocaine UR 05/24/2020 None Detected  NONE DETECTED (Cut Off Level 300 ng/mL) Preliminary  . POC Methamphetamine UR 05/24/2020 None Detected  NONE DETECTED (Cut Off Level 1000 ng/mL) Preliminary  . POC Morphine 05/24/2020 None Detected  NONE DETECTED (Cut Off Level 300 ng/mL) Preliminary  . POC Oxycodone UR 05/24/2020 None Detected  NONE DETECTED (Cut Off Level 100 ng/mL) Preliminary  . POC Methadone UR 05/24/2020 None Detected  NONE DETECTED (Cut Off Level 300 ng/mL) Preliminary  . POC Marijuana UR 05/24/2020 Positive* NONE DETECTED (Cut Off Level 50 ng/mL) Preliminary    Allergies: Penicillins and Penicillins  PTA Medications: (Not in a hospital admission)   Medical Decision Making  Admit to [facility] for overnight observation and continous assessment  Restarted home medication: Buspar 5mg  and Celexa 10mg  (celexa restarted at 10mg  because patient was off this med for over 3years). CIWA protocol for ETOH     Recommendations  Based on my evaluation the patient appears to have an emergency medical condition for which I recommend the patient be transferred to the emergency department for further evaluation.  Ophelia Shoulder, NP 05/24/20  1:48 AM

## 2020-05-24 NOTE — Progress Notes (Signed)
Patient resting quietly at this time, respirations even and unlabored, no objective signs of discomfort. Nursing staff will continue to monitor.

## 2020-05-24 NOTE — BH Assessment (Signed)
Comprehensive Clinical Assessment (CCA) Note  05/24/2020 Jorge Santos [id number]   Jorge Santos is a 20 year old male presenting as a voluntary walk-in at [facility] due to worsening paranoia. Patient reported, "I am paranoid all the time, from the time I wake up till the time I go to bed, my mine is always racing". Patient reported calling police as he felt unsafe regarding his paranoia and was brought to [facility]. Patient reported telling the police "Im giving up, I am loosing my mind". Patient reported smoking marijuana daily and drinking a 12 pack of beer daily. Patient stated "I drink to relax and to ease my mind of paranoia". Patient also reported hearing voices saying, not to go around there around people and also that people are staring at him. Patient denied SI, HI and psychosis. Patient reported poor sleep "barely get any sleep because of my mine racing". Patient also reported fair appetite. Patient reports worsening depressive symptoms.   Patient reported "I feel like people are out to get me and I feel like I'm been watched all the time". Patient reports that he is unable to maintain employment due to "feeling like someone is after me at my job." Patient reported not leaving home often due to paranoia. Patient stated "I took my daughter to the park the other day and I had to leave quickly because I felt like everyone was judging me and someone was after me."   Patient currently resides with mother and has visits from 4 year daughter, whom resides with her mother. Patient is currently unemployed. Patient denied access to guns.   Patient gave consent to speak with Jorge Santos, mother, [phone]. Mother reported no concerns regarding SI or HI. Mother reported patients behaviors has been without any incidents. Mother stated, "he is going through with 8 year old daughters mother, lot of drama". Mother reported no concerns regarding patients well being.   Disposition  Leandro Reasoner, FNP, recommends  continual observation for safety and stabilization with psych reassessment in the AM.   Chief Complaint:  Chief Complaint  Patient presents with  . Paranoid  . Anxiety   Visit Diagnosis: Major depressive disorder  CCA Screening, Triage and Referral (STR)  Patient Reported Information How did you hear about [place]? -- (police escort)  Referral name: No data recorded Referral phone number: No data recorded  Whom do you see for routine medical problems? Primary Care  Practice/Facility Name: No data recorded Practice/Facility Phone Number: No data recorded Name of Contact: No data recorded Contact Number: No data recorded Contact Fax Number: No data recorded Prescriber Name: No data recorded Prescriber Address (if known): No data recorded  What Is the Reason for Your Visit/Call Today? No data recorded How Long Has This Been Causing You Problems? > than 6 months  What Do You Feel Would Help You the Most Today? Other (Comment) ("I don't know if anyone can help me")  Have You Recently Been in Any Inpatient Treatment (Hospital/Detox/Crisis Center/28-Day Program)? No  Name/Location of Program/Hospital:No data recorded How Long Were You There? No data recorded When Were You Discharged? No data recorded  Have You Ever Received Services From [facility] Before? No  Who Do You See at [facility]? No data recorded  Have You Recently Had Any Thoughts About Hurting Yourself? No  Are You Planning to Commit Suicide/Harm Yourself At This time? No  Have you Recently Had Thoughts About [place]? No  Explanation: No data recorded  Have You Used Any Alcohol  or Drugs in the Past 24 Hours? Yes  How Long Ago Did You Use Drugs or Alcohol? 2000  What Did You Use and How Much? marijuana and alcohol  Do You Currently Have a Therapist/Psychiatrist? No  Name of Therapist/Psychiatrist: No data recorded  Have You Been Recently Discharged From Any Office Practice or Programs?  No  Explanation of Discharge From Practice/Program: No data recorded  CCA Screening Triage Referral Assessment Type of Contact: Face-to-Face  Is this Initial or Reassessment? No data recorded Date Telepsych consult ordered in [facility]:  No data recorded Time Telepsych consult ordered in [facility]:  No data recorded  Patient Reported Information Reviewed? Yes  Patient Left Without Being Seen? No data recorded Reason for Not Completing Assessment: No data recorded  Collateral Involvement: Jorge Santos, mother, [phone]  Does Patient Have a Court Appointed Legal Guardian? No data recorded Name and Contact of Legal Guardian: No data recorded If Minor and Not Living with Parent(s), Who has Custody? No data recorded Is CPS involved or ever been involved? Never  Is APS involved or ever been involved? Never  Patient Determined To Be At Risk for Harm To Self or Others Based on Review of Patient Reported Information or Presenting Complaint? No  Method: No data recorded Availability of Means: No data recorded Intent: No data recorded Notification Required: No data recorded Additional Information for Danger to Others Potential: No data recorded Additional Comments for Danger to Others Potential: No data recorded Are There Guns or Other Weapons in Your Home? No data recorded Types of Guns/Weapons: No data recorded Are These Weapons Safely Secured?                            No data recorded Who Could Verify You Are Able To Have These Secured: No data recorded Do You Have any Outstanding Charges, Pending Court Dates, Parole/Probation? No data recorded Contacted To Inform of Risk of Harm To Self or Others: No data recorded  Location of Assessment: [facility] Assessment [facility]  Does Patient Present under Involuntary Commitment? No  IVC Papers Initial File Date: No data recorded  [place] of Residence: [place]  Patient Currently Receiving the Following Services: Not Receiving  Services  Determination of Need: Urgent (48 hours)  Options For Referral: Medication Management; Intensive Outpatient Therapy; Outpatient Therapy  CCA Biopsychosocial Intake/Chief Complaint:  Paranioa and worsening depression symptoms.  Current Symptoms/Problems: Paranioa and worsening depression symptoms.  Patient Reported Schizophrenia/Schizoaffective Diagnosis in Past: No  Strengths: Self-awareness  Preferences: No data recorded Abilities: No data recorded  Type of Services Patient Feels are Needed: "I don't know if anyone can help me"  Initial Clinical Notes/Concerns: No data recorded  Mental Health Symptoms Depression:  Irritability; Change in energy/activity; Difficulty Concentrating; Sleep (too much or little); Tearfulness; Fatigue; Hopelessness; Increase/decrease in appetite; Worthlessness   Duration of Depressive symptoms: Greater than two weeks   Mania:  Racing thoughts   Anxiety:   Difficulty concentrating; Fatigue; Irritability; Restlessness; Worrying; Tension; Sleep   Psychosis:  Delusions; Hallucinations   Duration of Psychotic symptoms: Greater than six months   Trauma:  Difficulty staying/falling asleep   Obsessions:  Disrupts routine/functioning; Recurrent & persistent thoughts/impulses/images   Compulsions:  Disrupts with routine/functioning   Inattention:  N/A   Hyperactivity/Impulsivity:  N/A   Oppositional/Defiant Behaviors:  None   Emotional Irregularity:  Chronic feelings of emptiness; Transient, stress-related paranoia/disassociation; Intense/inappropriate anger   Other Mood/Personality Symptoms:  No data recorded  Mental Status Exam Appearance and self-care  Stature:  Average   Weight:  Average weight   Clothing:  No data recorded  Grooming:  No data recorded  Cosmetic use:  No data recorded  Posture/gait:  Normal   Motor activity:  Not Remarkable   Sensorium  Attention:  Normal   Concentration:  Anxiety interferes    Orientation:  X5   Recall/memory:  Normal   Affect and Mood  Affect:  Anxious; Depressed; Flat; Tearful   Mood:  Angry; Anxious; Depressed; Hopeless   Relating  Eye contact:  None   Facial expression:  Depressed; Anxious; Sad; Tense; Fearful   Attitude toward examiner:  Cooperative; Passive   Thought and Language  Speech flow: Clear and Coherent; Normal   Thought content:  Appropriate to Mood and Circumstances   Preoccupation:  No data recorded  Hallucinations:  Other (Comment)   Organization:  No data recorded  [employer] of Knowledge:  Average   Intelligence:  Above Average   Abstraction:  Abstract   Judgement:  Poor   Reality Testing:  No data recorded  Insight:  Shallow   Decision Making:  No data recorded  Social Functioning  Social Maturity:  Impulsive; Irresponsible   Social Judgement:  No data recorded  Stress  Stressors:  Transitions; Relationship   Coping Ability:  Deficient supports; Overwhelmed; Exhausted   Skill Deficits:  Self-control; Self-care; Decision making   Supports:  Family    Religion: Religion/Spirituality Are You A Religious Person?: No  Leisure/Recreation: Leisure / Recreation Do You Have Hobbies?: No Leisure and Hobbies: uta  Exercise/Diet: Exercise/Diet Do You Exercise?: No Have You Gained or Lost A Significant Amount of Weight in the Past Six Months?: No Do You Follow a Special Diet?: No Do You Have Any Trouble Sleeping?: No  CCA Employment/Education Employment/Work Situation: Employment / Work Copywriter, advertising Employment situation: Unemployed Has patient ever been in the [employer]?: No  Education: Education Is Patient Currently Attending School?: No Last Grade Completed: 12 Did Teacher, adult education From [employer]?: Yes Did Physicist, medical?: No Did You Have Any Chief Technology Officer In School?: uta Did You Have An Individualized Education Program (IIEP): No Did You Have Any Difficulty At School?:  No Patient's Education Has Been Impacted by Current Illness: No  CCA Family/Childhood History Family and Relationship History: Family history Does patient have children?: Yes How many children?: 1 How is patient's relationship with their children?: good  Childhood History:  Childhood History By whom was/is the patient raised?: Grandparents Description of patient's relationship with caregiver when they were a child: uta Patient's description of current relationship with people who raised him/her: uta How were you disciplined when you got in trouble as a child/adolescent?: normal Does patient have siblings?:  (uta) Did patient suffer any verbal/emotional/physical/sexual abuse as a child?: No Did patient suffer from severe childhood neglect?: No Has patient ever been sexually abused/assaulted/raped as an adolescent or adult?: No Was the patient ever a victim of a crime or a disaster?: No Witnessed domestic violence?: No Has patient been affected by domestic violence as an adult?: No  Child/Adolescent Assessment:   CCA Substance Use Alcohol/Drug Use: Alcohol / Drug Use Pain Medications: see MAR Prescriptions: see MAR Over the Counter: see MAR History of alcohol / drug use?: Yes Substance #1 Name of Substance 1: marijuana 1 - Amount (size/oz): uta 1 - Duration: uta 1 - Method of Aquiring: uta 1- Route of Use: smoking Substance #2 Name of Substance 2: alcohol  2 - Amount (size/oz): 12 pack 2 - Duration: uta 2 - Method of Aquiring: drinking 2 - Route of Substance Use: n/a   ASAM's:  Six Dimensions of Multidimensional Assessment  Dimension 1:  Acute Intoxication and/or Withdrawal Potential:      Dimension 2:  Biomedical Conditions and Complications:      Dimension 3:  Emotional, Behavioral, or Cognitive Conditions and Complications:     Dimension 4:  Readiness to Change:     Dimension 5:  Relapse, Continued use, or Continued Problem Potential:     Dimension 6:   Recovery/Living Environment:     ASAM Severity Score:    ASAM Recommended Level of Treatment:     Substance use Disorder (SUD)   Recommendations for Services/Supports/Treatments: Recommendations for Services/Supports/Treatments Recommendations For Services/Supports/Treatments: Medication Management,Individual Therapy,CD-IOP Intensive Chemical Dependency Program,Peer Support  DSM5 Diagnoses: Patient Active Problem List   Diagnosis Date Noted  . Respiratory distress   . SOB (shortness of breath)   . Encounter for orogastric tube placement   . Hypoxia   . Anxiety disorder 10/24/2017  . Tobacco abuse   . Community acquired pneumonia 10/18/2017  . GERD without esophagitis 02/28/2017  . Attention deficit hyperactivity disorder (ADHD), combined type, moderate 07/08/2015  . Bipolar affective disorder ([place]) 07/08/2015  . Cannabis use disorder, moderate, in early remission, dependence ([place]) 07/08/2015  . Oppositional defiant disorder, moderate 07/08/2015   Patient Centered Plan: Patient is on the following Treatment Plan(s):    Referrals to Alternative Service(s): Referred to Alternative Service(s):   Place:   Date:   Time:    Referred to Alternative Service(s):   Place:   Date:   Time:    Referred to Alternative Service(s):   Place:   Date:   Time:    Referred to Alternative Service(s):   Place:   Date:   Time:     Venora Maples, [facility]

## 2020-05-24 NOTE — ED Provider Notes (Signed)
FBC/OBS ASAP Discharge Summary  Date and Time: 05/24/2020 11:21 AM  Name: Jorge Santos  MRN:  [id number]   Discharge Diagnoses:  Final diagnoses:  Paranoia [facility])  Generalized anxiety disorder  Moderate episode of recurrent major depressive disorder ([place])    Subjective: Patient upon follow-up assessment this morning was pleasant and cooperative with fair engagement. Patient stated he was able to sleep well over the night after being processed at the [facility] and speaking to the provider. Patient endorsed he has been suffering from increasingly worsening depressive mood over the last year with paranoid ideations and auditory visual hallucinations, though most recently, things have gotten worse since he lost his job in early January of this year and has been having increasing tensions at home with his mother-who he states he does not get along with. Patient endorsed he has been experiencing trouble sleeping (trouble falling asleep), lack of interest or pleasure in activities, feelings of worthlessness/hopelessness, and increased irritability. Patient denied suicidal and homicidal ideations, endorsed protective factors such as stating he could never harm himself, because he has to get himself together for his daughter and that he lives for her. Patient expressed frustrations with not being able to hold a job, stating his worsening mood and paranoia have been the cause, and he wants to "get better" for his daughter. Patient endorsed this has been over the last year he has been feeling paranoid about being around people and this has caused him to lose his job and have difficulties being productive, which he endorsed has further contributed to his depressive mood. Patient endorsed he believes he has been experiencing auditory and visual hallucinations, but denied currently experiencing them at the facility and during our engagement. Patient did not appear to be responding to internal stimuli. Patient expanded on  what he perceived to be auditory hallucinations stating "they would say negative things about stuff I was doing" and stated he has experienced visual hallucinations of "I would get really paranoid and I would sometimes feel I would see shadows". Patient endorsed he drinks "casually" in the evening, but two or three times a month he feels it gets "out of control". Patient additionally endorsed marijuana use. Patient when discussing previous medication history endorsed he stopped taking his medications because "I felt I didn't need them anymore, they were just for anger, and once I got myself into a good place-I stopped". Discussed with the patient resources for substance use, but patient endorsed he was not interested in resources for his use at this time stating "that's not a problem for me". Discussed with patient resources and options for treatment of his worsening depressive mood, paranoid ideations, and hallucinations. Patient endorsed he was not interested in being at the hospital any longer then he needed to be and that he was amenable to any recommendations the provider team could offer as far as outpatient services. Patient endorsed he was not seeking inpatient services, because he is more focused on finding a new job. Discussed outpatient resources and medications that could be beneficial that patient was amenable to. Patient endorsed he felt that with plan of care discussed that he could be safe to discharge and continue with outpatient services discussed. Patient gave permission to speak with his mother who was contacted for collateral information.   Per patients mother, the patient has been experiencing worsening depressive and irritable mood over the last several months and more recently further digression of his mood since the loss of his job. Patient's mother further states the  patient has been having increasing familial conflict with the patient's "baby momma" which has not helped. Per patient's  mother the patient can return back home and she does not report any safety concerns for the patient or herself. The patient's mother endorsed there are no dangerous weapons within the family home. Mother reports she can support the patient and will pick him up upon discharge from the [facility].   Stay Summary: Patient is a 20 year old male with past psychiatric history of DMDD, ODD, and GAD who arrived to the [facility] accompanied by law enforcement after he called them reporting that he felt unsafe. Patient upon arrival to the [facility] was assessed by the provider team and during evaluation endorsed worsening depressive mood, paranoid ideations, and hallucinations, without suicidal or homicidal ideations. Patient additionally endorsed recent loss of job due to worsening mental health. Patient endorsed alcohol and marijuana use regularly to cope with stressors and reported paranoid ideations. Patient held overnight with start of home medications to begin the following morning that patient was amenable to.   Patient reassessed the following morning by this Probation officer. Patient endorsed upon assessment continued depressive mood and feeling down, but denied suicidal and/or homicidal ideations, and endorse multiple protective factors. Patient denied auditory or visual hallucinations and denied paranoia. Discussed the patient's substance abuse and offered resources, but patient declined resources offered for this. Discussed treatment options for the patient's reported worsening mood, paranoid ideations, and hallucinations. Patient was amenable to restarting previous home medication and additional medications offered for sleep and anxiety. Medications prescribed 20 mg P.O. daily Celexa, buspar 5 mg P.O. BID, trazodone 50 mg P.O. as needed for sleep, and vistaril 25 mg P.O. as needed for anxiety. Patient given outpatient resources that he can follow-up with and patient to be picked-up by mother.   Total Time spent with patient: 30  minutes  Past Psychiatric History: DMDD, ODD, GAD Past Medical History: History reviewed. No pertinent past medical history. History reviewed. No pertinent surgical history. Family History: History reviewed. No pertinent family history. Family Psychiatric History: None reported Social History:  Social History   Substance and Sexual Activity  Alcohol Use No     Social History   Substance and Sexual Activity  Drug Use Yes  . Types: Marijuana   Comment:  Smokes about 5-6 times per week as of October 18, 2017    Social History   Socioeconomic History  . Marital status: Single    Spouse name: Not on file  . Number of children: Not on file  . Years of education: Not on file  . Highest education level: Not on file  Occupational History  . Not on file  Tobacco Use  . Smoking status: Current Every Day Smoker    Packs/day: 0.50    Years: 4.00    Pack years: 2.00    Types: Cigarettes  . Smokeless tobacco: Former Systems developer    Types: Snuff  Vaping Use  . Vaping Use: Never used  Substance and Sexual Activity  . Alcohol use: No  . Drug use: Yes    Types: Marijuana    Comment:  Smokes about 5-6 times per week as of October 18, 2017  . Sexual activity: Yes  Other Topics Concern  . Not on file  Social History Narrative   ** Merged History Encounter **       Social Determinants of Health   Financial Resource Strain: Not on file  Food Insecurity: Not on file  Transportation Needs: Not  on file  Physical Activity: Not on file  Stress: Not on file  Social Connections: Not on file   SDOH:  SDOH Screenings   Alcohol Screen: Not on file  Depression (KDX8-3): Not on file  Financial Resource Strain: Not on file  Food Insecurity: Not on file  Housing: Not on file  Physical Activity: Not on file  Social Connections: Not on file  Stress: Not on file  Tobacco Use: High Risk  . Smoking Tobacco Use: Current Every Day Smoker  . Smokeless Tobacco Use: Former Soil scientist Needs: Not  on file    Has this patient used any form of tobacco in the last 30 days? (Cigarettes, Smokeless Tobacco, Cigars, and/or Pipes) A prescription for an FDA-approved tobacco cessation medication was offered at discharge and the patient refused  Current Medications:  Current Facility-Administered Medications  Medication Dose Route Frequency Provider Last Rate Last Admin  . acetaminophen (TYLENOL) tablet 650 mg  650 mg Oral Q6H PRN Ajibola, Ene A, NP      . busPIRone (BUSPAR) tablet 5 mg  5 mg Oral BID Ajibola, Ene A, NP   5 mg at 05/24/20 0952  . citalopram (CELEXA) tablet 10 mg  10 mg Oral Daily Ajibola, Ene A, NP   10 mg at 05/24/20 0950  . hydrOXYzine (ATARAX/VISTARIL) tablet 25 mg  25 mg Oral Q6H PRN Ajibola, Ene A, NP      . loperamide (IMODIUM) capsule 2-4 mg  2-4 mg Oral PRN Ajibola, Ene A, NP      . LORazepam (ATIVAN) tablet 1 mg  1 mg Oral Q6H PRN Ajibola, Ene A, NP      . magnesium hydroxide (MILK OF MAGNESIA) suspension 30 mL  30 mL Oral Daily PRN Ajibola, Ene A, NP      . multivitamin with minerals tablet 1 tablet  1 tablet Oral Daily Ajibola, Ene A, NP   1 tablet at 05/24/20 0950  . ondansetron (ZOFRAN-ODT) disintegrating tablet 4 mg  4 mg Oral Q6H PRN Ajibola, Ene A, NP      . [START ON 05/25/2020] thiamine tablet 100 mg  100 mg Oral Daily Ajibola, Ene A, NP      . traZODone (DESYREL) tablet 50 mg  50 mg Oral QHS PRN Ajibola, Ene A, NP   50 mg at 05/24/20 0202   Current Outpatient Medications  Medication Sig Dispense Refill  . busPIRone (BUSPAR) 5 MG tablet Take 1 tablet (5 mg total) by mouth 2 (two) times daily. 60 tablet 0  . citalopram (CELEXA) 20 MG tablet Take 1 tablet (20 mg total) by mouth daily. 30 tablet 0  . hydrOXYzine (ATARAX/VISTARIL) 25 MG tablet Take 1 tablet (25 mg total) by mouth every 6 (six) hours as needed for anxiety. 30 tablet 0  . traZODone (DESYREL) 50 MG tablet Take 1 tablet (50 mg total) by mouth at bedtime as needed for sleep. 30 tablet 0    PTA  Medications: (Not in a hospital admission)   Musculoskeletal  Strength & Muscle Tone: within normal limits Gait & Station: normal Patient leans: N/A  Psychiatric Specialty Exam  Presentation  General Appearance: Appropriate for Environment  Eye Contact:Good  Speech:Clear and Coherent; Normal Rate  Speech Volume:Normal  Handedness:Right   Mood and Affect  Mood:Depressed  Affect:Tearful; Depressed   Thought Process  Thought Processes:Coherent  Descriptions of Associations:Intact  Orientation:Full (Time, Place and Person)  Thought Content:WDL  Hallucinations:Hallucinations: Visual Description of Visual Hallucinations: patient reports that he sees shadows  and images  Ideas of Reference:Paranoia  Suicidal Thoughts:Suicidal Thoughts: No  Homicidal Thoughts:Homicidal Thoughts: No   Sensorium  Memory:Immediate Good; Remote Good; Recent Good  Judgment:Good  Insight:Good   Executive Functions  Concentration:Good  Attention Span:Good  [place] of Knowledge:Good  Language:Good   Psychomotor Activity  Psychomotor Activity:Psychomotor Activity: Normal   Assets  Assets:Communication Skills; Desire for Improvement; Housing; Physical Health; Transportation   Sleep  Sleep:Sleep: Poor   Physical Exam  Physical Exam Vitals and nursing note reviewed.  Constitutional:      Appearance: He is well-developed.  HENT:     Head: Normocephalic.  Eyes:     Pupils: Pupils are equal, round, and reactive to light.  Cardiovascular:     Rate and Rhythm: Normal rate.  Pulmonary:     Effort: Pulmonary effort is normal.  Musculoskeletal:        General: Normal range of motion.  Neurological:     Mental Status: He is alert and oriented to person, place, and time.    Review of Systems  Constitutional: Negative.   HENT: Negative.   Eyes: Negative.   Respiratory: Negative.   Cardiovascular: Negative.   Gastrointestinal: Negative.   Genitourinary:  Negative.   Musculoskeletal: Negative.   Skin: Negative.   Neurological: Negative.   Endo/Heme/Allergies: Negative.   Psychiatric/Behavioral: Negative.    Blood pressure 121/86, pulse 70, temperature 97.8 F (36.6 C), temperature source Oral, resp. rate 18, height 5\' 6"  (1.676 m), weight 72.6 kg, SpO2 99 %. Body mass index is 25.82 kg/m.  Demographic Factors:  Male, Adolescent or young adult, Caucasian, Low socioeconomic status and Unemployed  Loss Factors: Decrease in vocational status and Financial problems/change in socioeconomic status  Historical Factors: NA  Risk Reduction Factors:   Responsible for children under 87 years of age, Sense of responsibility to family, Living with another person, especially a relative and Positive social support  Continued Clinical Symptoms:  Depression:   Hopelessness Alcohol/Substance Abuse/Dependencies Previous Psychiatric Diagnoses and Treatments  Cognitive Features That Contribute To Risk:  None    Suicide Risk:  Mild:  Suicidal ideation of limited frequency, intensity, duration, and specificity.  There are no identifiable plans, no associated intent, mild dysphoria and related symptoms, good self-control (both objective and subjective assessment), few other risk factors, and identifiable protective factors, including available and accessible social support.  Plan Of Care/Follow-up recommendations:  Continue activity as tolerated. Continue diet as recommended by your PCP. Ensure to keep all appointments with outpatient providers.  Disposition: Discharge home with mother  Lewis Shock, FNP 05/24/2020, 11:21 AM

## 2020-05-24 NOTE — ED Notes (Addendum)
Pt sleeping'@this'$  time. breathing even and unlabored. Will continue to monitor for safety

## 2020-05-24 NOTE — ED Triage Notes (Signed)
Pt presents with paranoia and anxiety increasing in severity for months.  Pt reports he hears whispers and sees shadows.  Denies SI or HI.

## 2020-05-24 NOTE — ED Provider Notes (Incomplete Revision)
Behavioral Health Admission H&P [facility] & OBS)  Date: 05/24/20 Patient Name: Jorge Santos MRN: [id number] Chief Complaint:  Chief Complaint  Patient presents with  . Paranoid  . Anxiety   Chief Complaint/Presenting Problem: Paranioa and worsening depression symptoms.  Diagnoses:  Final diagnoses:  Paranoia ([name])  Generalized anxiety disorder    JIR:CVELFYB Jorge Santos is a 20 y/o male with past psychiatric history of DMDD, ODD, and generalized anxiety disorder. Patient presented voluntarily to [facility] with complain of worsening paranoia. Patient is alert and orient X4. Patient is tearful/crying, his speech is clear, coherent and normal rate. He reports "I'm paranoid all the time, I'm paranoid from the time I wake up til I fall asleep. I can't take it anymore." He report that he was drinking beer earlier today to "ease my mind" and felt unsafe so he called 911 to seek help. He admits to drinking about 12 cans of beers prior to calling 911. He states "I felt like giving up."   He reports that he is unable to sleep due to paranoia. He states "I feel like people are out to get me and I feel like I'm been watched all the time." He reports that his paranoid feelings are becoming too intrusive. He reports that he is unable to maintain employment due to "feeling like someone is after me at my job." He also reports that he doesn't leave his home often due to paranoia. He states "I took my daughter to the park the other day and I had to leave quickly because I felt like everyone was judging me and someone was after me."   He reports that he lives at home with his mother; he has a 51years old daughter that comes to visit him. He reports that he has a high school diploma and he is currently unemployed. Patient endorses alcohol abuse; he reports that he drinks beers daily. He states "I drink alcohol to relax and ease my mind from the paranoia." patient denies a history of seizures or seizures related alcohol  withdraw. He endorses Marijuana use, he denies using any other substances.   Patient reports that he feels depressed. He endorses depressive symptoms such as restless sleep, poor apetite, irritability, isolation, hopelessness, and loss of pleasure in activity.  He reports that he has not taken his psychiatric medication for over 3 years. He refused to state his reasons for stopping these medications.    Patient denies suicidal and homicidal ideations. He denies auditory hallucination, he endorses visual hallucination of "seeing shadows following me." he endorse paranoia.    PHQ 2-9:  [employer] Office Visit from 11/10/2017 in [place]  Thoughts that you would be better off dead, or of hurting yourself in some way Not at all  PHQ-9 Total Score 11      [facility] ED from 05/24/2020 in [place] No Risk       Total Time spent with patient: 30 minutes  Musculoskeletal  Strength & Muscle Tone: within normal limits Gait & Station: normal Patient leans: Right  Psychiatric Specialty Exam  Presentation General Appearance: Appropriate for Environment  Eye Contact:Good  Speech:Clear and Coherent; Normal Rate  Speech Volume:Normal  Handedness:Right   Mood and Affect  Mood:Depressed  Affect:Tearful; Depressed   Thought Process  Thought Processes:Coherent  Descriptions of Associations:Intact  Orientation:Full (Time, Place and Person)  Thought Content:WDL  Hallucinations:Hallucinations: Visual Description of Visual Hallucinations: patient reports that he sees shadows  and images  Ideas of Reference:Paranoia  Suicidal Thoughts:Suicidal Thoughts: No  Homicidal Thoughts:Homicidal Thoughts: No   Sensorium  Memory:Immediate Good; Remote Good; Recent Good  Judgment:Good  Insight:Good   Executive Functions  Concentration:Good  Attention Span:Good  [place] of  Knowledge:Good  Language:Good   Psychomotor Activity  Psychomotor Activity:Psychomotor Activity: Normal   Assets  Assets:Communication Skills; Desire for Improvement; Housing; Physical Health; Transportation   Sleep  Sleep:Sleep: Poor   Physical Exam Vitals and nursing note reviewed.  Constitutional:      General: He is not in acute distress.    Appearance: He is well-developed and well-nourished. He is not ill-appearing, toxic-appearing or diaphoretic.  HENT:     Head: Normocephalic and atraumatic.  Eyes:     Conjunctiva/sclera: Conjunctivae normal.  Cardiovascular:     Rate and Rhythm: Normal rate.  Pulmonary:     Effort: Pulmonary effort is normal. No respiratory distress.     Breath sounds: Normal breath sounds.  Abdominal:     Palpations: Abdomen is soft.  Musculoskeletal:        General: No edema.     Cervical back: Normal range of motion.  Skin:    Coloration: Skin is not jaundiced.  Neurological:     Mental Status: He is alert and oriented to person, place, and time.  Psychiatric:        Attention and Perception: He is attentive. He perceives visual hallucinations. He does not perceive auditory hallucinations.        Mood and Affect: Mood is anxious and depressed.        Speech: Speech is not tangential.        Behavior: Behavior normal.        Thought Content: Thought content is paranoid.        Cognition and Memory: Cognition normal.        Judgment: Judgment normal.    Review of Systems  Constitutional: Negative for chills, fever, malaise/fatigue and weight loss.  HENT: Negative for ear discharge and ear pain.   Eyes: Negative for pain and discharge.  Respiratory: Negative for cough, hemoptysis, sputum production and shortness of breath.   Cardiovascular: Negative for chest pain and palpitations.  Gastrointestinal: Negative for nausea and vomiting.  Skin: Negative for itching.  Neurological: Negative for seizures and loss of consciousness.   Psychiatric/Behavioral: Positive for depression, hallucinations and substance abuse. Negative for suicidal ideas. The patient is nervous/anxious.     Blood pressure 135/87, pulse (!) 110, temperature 99.2 F (37.3 C), temperature source Temporal, resp. rate 18, height 5\' 6"  (1.676 m), weight 160 lb (72.6 kg), SpO2 97 %. Body mass index is 25.82 kg/m.  Past Psychiatric History: ODD, DMDD, GAD   Is the patient at risk to self? No  Has the patient been a risk to self in the past 6 months? No .    Has the patient been a risk to self within the distant past? No   Is the patient a risk to others? No   Has the patient been a risk to others in the past 6 months? No   Has the patient been a risk to others within the distant past? No   Past Medical History: History reviewed. No pertinent past medical history. History reviewed. No pertinent surgical history.  Family History: History reviewed. No pertinent family history.  Social History:  Social History   Socioeconomic History  . Marital status: Single    Spouse name: Not on file  .  Number of children: Not on file  . Years of education: Not on file  . Highest education level: Not on file  Occupational History  . Not on file  Tobacco Use  . Smoking status: Current Every Day Smoker    Packs/day: 0.50    Years: 4.00    Pack years: 2.00    Types: Cigarettes  . Smokeless tobacco: Former Systems developer    Types: Snuff  Vaping Use  . Vaping Use: Never used  Substance and Sexual Activity  . Alcohol use: No  . Drug use: Yes    Types: Marijuana    Comment:  Smokes about 5-6 times per week as of October 18, 2017  . Sexual activity: Yes  Other Topics Concern  . Not on file  Social History Narrative   ** Merged History Encounter **       Social Determinants of Health   Financial Resource Strain: Not on file  Food Insecurity: Not on file  Transportation Needs: Not on file  Physical Activity: Not on file  Stress: Not on file  Social  Connections: Not on file  Intimate Partner Violence: Not on file    SDOH:  SDOH Screenings   Alcohol Screen: Not on file  Depression (NFA2-1): Not on file  Financial Resource Strain: Not on file  Food Insecurity: Not on file  Housing: Not on file  Physical Activity: Not on file  Social Connections: Not on file  Stress: Not on file  Tobacco Use: High Risk  . Smoking Tobacco Use: Current Every Day Smoker  . Smokeless Tobacco Use: Former Soil scientist Needs: Not on file    Last Labs:  Admission on 05/24/2020  Component Date Value Ref Range Status  . SARS Coronavirus 2 by RT PCR 05/24/2020 NEGATIVE  NEGATIVE Final   Comment: (NOTE) SARS-CoV-2 target nucleic acids are NOT DETECTED.  The SARS-CoV-2 RNA is generally detectable in upper respiratory specimens during the acute phase of infection. The lowest concentration of SARS-CoV-2 viral copies this assay can detect is 138 copies/mL. A negative result does not preclude SARS-Cov-2 infection and should not be used as the sole basis for treatment or other patient management decisions. A negative result may occur with  improper specimen collection/handling, submission of specimen other than nasopharyngeal swab, presence of viral mutation(s) within the areas targeted by this assay, and inadequate number of viral copies(<138 copies/mL). A negative result must be combined with clinical observations, patient history, and epidemiological information. The expected result is Negative.  Fact Sheet for Patients:  EntrepreneurPulse.com.au  Fact Sheet for Healthcare Providers:  IncredibleEmployment.be  This test is no                          t yet approved or cleared by the [place] FDA and  has been authorized for detection and/or diagnosis of SARS-CoV-2 by FDA under an Emergency Use Authorization (EUA). This EUA will remain  in effect (meaning this test can be used) for the duration of  the COVID-19 declaration under Section 564(b)(1) of the Act, 21 U.S.C.section 360bbb-3(b)(1), unless the authorization is terminated  or revoked sooner.      . Influenza A by PCR 05/24/2020 NEGATIVE  NEGATIVE Final  . Influenza B by PCR 05/24/2020 NEGATIVE  NEGATIVE Final   Comment: (NOTE) The Xpert Xpress SARS-CoV-2/FLU/RSV plus assay is intended as an aid in the diagnosis of influenza from Nasopharyngeal swab specimens and should not be used as a sole  basis for treatment. Nasal washings and aspirates are unacceptable for Xpert Xpress SARS-CoV-2/FLU/RSV testing.  Fact Sheet for Patients: EntrepreneurPulse.com.au  Fact Sheet for Healthcare Providers: IncredibleEmployment.be  This test is not yet approved or cleared by the [place] FDA and has been authorized for detection and/or diagnosis of SARS-CoV-2 by FDA under an Emergency Use Authorization (EUA). This EUA will remain in effect (meaning this test can be used) for the duration of the COVID-19 declaration under Section 564(b)(1) of the Act, 21 U.S.C. section 360bbb-3(b)(1), unless the authorization is terminated or revoked.  Performed at [facility], [place] [address], [place], [place] [address]   . SARS Coronavirus 2 Ag 05/24/2020 Negative  Negative Preliminary  . WBC 05/24/2020 6.1  4.0 - 10.5 K/uL Final  . RBC 05/24/2020 5.28  4.22 - 5.81 MIL/uL Final  . Hemoglobin 05/24/2020 17.2* 13.0 - 17.0 g/dL Final  . HCT 05/24/2020 47.1  39.0 - 52.0 % Final  . MCV 05/24/2020 89.2  80.0 - 100.0 fL Final  . MCH 05/24/2020 32.6  26.0 - 34.0 pg Final  . MCHC 05/24/2020 36.5* 30.0 - 36.0 g/dL Final   CORRECTED FOR COLD AGGLUTININS  . RDW 05/24/2020 12.3  11.5 - 15.5 % Final  . Platelets 05/24/2020 384  150 - 400 K/uL Final  . nRBC 05/24/2020 0.0  0.0 - 0.2 % Final  . Neutrophils Relative % 05/24/2020 47  % Final  . Neutro Abs 05/24/2020 2.9  1.7 - 7.7 K/uL Final  . Lymphocytes Relative  05/24/2020 35  % Final  . Lymphs Abs 05/24/2020 2.2  0.7 - 4.0 K/uL Final  . Monocytes Relative 05/24/2020 9  % Final  . Monocytes Absolute 05/24/2020 0.6  0.1 - 1.0 K/uL Final  . Eosinophils Relative 05/24/2020 7  % Final  . Eosinophils Absolute 05/24/2020 0.4  0.0 - 0.5 K/uL Final  . Basophils Relative 05/24/2020 1  % Final  . Basophils Absolute 05/24/2020 0.1  0.0 - 0.1 K/uL Final  . Immature Granulocytes 05/24/2020 1  % Final  . Abs Immature Granulocytes 05/24/2020 0.03  0.00 - 0.07 K/uL Final   Performed at [facility], [place] [address] [place],  [address]  . Sodium 05/24/2020 141  135 - 145 mmol/L Final  . Potassium 05/24/2020 3.7  3.5 - 5.1 mmol/L Final  . Chloride 05/24/2020 107  98 - 111 mmol/L Final  . CO2 05/24/2020 21* 22 - 32 mmol/L Final  . Glucose, Bld 05/24/2020 94  70 - 99 mg/dL Final   Glucose reference range applies only to samples taken after fasting for at least 8 hours.  . BUN 05/24/2020 10  6 - 20 mg/dL Final  . Creatinine, Ser 05/24/2020 0.84  0.61 - 1.24 mg/dL Final  . Calcium 05/24/2020 9.6  8.9 - 10.3 mg/dL Final  . Total Protein 05/24/2020 7.2  6.5 - 8.1 g/dL Final  . Albumin 05/24/2020 3.8  3.5 - 5.0 g/dL Final  . AST 05/24/2020 26  15 - 41 U/L Final  . ALT 05/24/2020 25  0 - 44 U/L Final  . Alkaline Phosphatase 05/24/2020 59  38 - 126 U/L Final  . Total Bilirubin 05/24/2020 0.5  0.3 - 1.2 mg/dL Final  . GFR, Estimated 05/24/2020 >60  >60 mL/min Final   Comment: (NOTE) Calculated using the CKD-EPI Creatinine Equation (2021)   . Anion gap 05/24/2020 13  5 - 15 Final   Performed at [facility] [place],  [place] [address]  . Cholesterol 05/24/2020 216* 0 - 200 mg/dL Final  . Triglycerides 05/24/2020 413* <150 mg/dL Final  . HDL 05/24/2020 55  >40 mg/dL Final  . Total CHOL/HDL Ratio 05/24/2020 3.9  RATIO Final  . VLDL 05/24/2020 UNABLE TO CALCULATE IF TRIGLYCERIDE OVER 400 mg/dL  0 - 40 mg/dL Final  . LDL Cholesterol  05/24/2020 UNABLE TO CALCULATE IF TRIGLYCERIDE OVER 400 mg/dL  0 - 99 mg/dL Final   Comment:        Total Cholesterol/HDL:CHD Risk Coronary Heart Disease Risk Table                     Men   Women  1/2 Average Risk   3.4   3.3  Average Risk       5.0   4.4  2 X Average Risk   9.6   7.1  3 X Average Risk  23.4   11.0        Use the calculated Patient Ratio above and the CHD Risk Table to determine the patient's CHD Risk.        ATP III CLASSIFICATION (LDL):  <100     mg/dL   Optimal  100-129  mg/dL   Near or Above                    Optimal  130-159  mg/dL   Borderline  160-189  mg/dL   High  >190     mg/dL   Very High Performed at [facility] [address] [place], [place] [address]   . Hgb A1c MFr Bld 05/24/2020 5.3  4.8 - 5.6 % Final   Comment: (NOTE) Pre diabetes:          5.7%-6.4%  Diabetes:              >6.4%  Glycemic control for   <7.0% adults with diabetes   . Mean Plasma Glucose 05/24/2020 105.41  mg/dL Final   Performed at [facility] [address], [place], [place] [address]  . TSH 05/24/2020 1.038  0.350 - 4.500 uIU/mL Final   Comment: Performed by a 3rd Generation assay with a functional sensitivity of <=0.01 uIU/mL. Performed at [facility], [place] [address], [place], [place] [address]   . POC Amphetamine UR 05/24/2020 None Detected  NONE DETECTED (Cut Off Level 1000 ng/mL) Preliminary  . POC Secobarbital (BAR) 05/24/2020 None Detected  NONE DETECTED (Cut Off Level 300 ng/mL) Preliminary  . POC Buprenorphine (BUP) 05/24/2020 None Detected  NONE DETECTED (Cut Off Level 10 ng/mL) Preliminary  . POC Oxazepam (BZO) 05/24/2020 None Detected  NONE DETECTED (Cut Off Level 300 ng/mL) Preliminary  . POC Cocaine UR 05/24/2020 None Detected  NONE DETECTED (Cut Off Level 300 ng/mL) Preliminary  . POC Methamphetamine UR 05/24/2020 None Detected  NONE DETECTED (Cut Off Level 1000 ng/mL) Preliminary  . POC Morphine 05/24/2020 None Detected  NONE  DETECTED (Cut Off Level 300 ng/mL) Preliminary  . POC Oxycodone UR 05/24/2020 None Detected  NONE DETECTED (Cut Off Level 100 ng/mL) Preliminary  . POC Methadone UR 05/24/2020 None Detected  NONE DETECTED (Cut Off Level 300 ng/mL) Preliminary  . POC Marijuana UR 05/24/2020 Positive* NONE DETECTED (Cut Off Level 50 ng/mL) Preliminary  . Direct LDL 05/24/2020 122.8* 0 - 99 mg/dL Final   Performed at [facility] [address], [place], [place] [address]    Allergies: Penicillins and Penicillins  PTA Medications: (Not in a hospital admission)   Medical Decision Making  Admit to [facility] for overnight observation and continous assessment  Restarted home medication: Buspar 5mg  and Celexa 10mg  (celexa restarted at 10mg  because patient was off this med for over 3years). CIWA protocol for ETOH     Recommendations  Based on my evaluation the patient appears to have an emergency medical condition for which I recommend the patient be transferred to the emergency department for further evaluation.  Ophelia Shoulder, NP 05/24/20  5:07 AM

## 2020-05-24 NOTE — Discharge Planning (Signed)
[name] to be D/C'd Home per MD order.  Discussed with the patient and all questions fully answered.  An After Visit Summary was printed and given to the patient. Patient received paper prescriptions and follow up resources for therapy and medication management.  D/c education completed with patient/family including follow up instructions, medication list, d/c activities limitations if indicated, with other d/c instructions as indicated by MD - patient able to verbalize understanding, all questions fully answered.   Patient instructed to return to ED, call 911, or call MD for any changes in condition.   Patient escorted via [place], and D/C home via private auto.

## 2020-05-24 NOTE — Progress Notes (Signed)
Patient is alert  And oriented, very sullen, states" I feel the same, I wouldn't call what I hear voices but more like whispers that come and go, I still feel paranoid not sure what else to do." Patient denies SI this morning, states he was able to sleep off and on. Patient received scheduled medications. Nursing staff will continue to monitor patient.

## 2020-05-24 NOTE — Progress Notes (Signed)
Informed by nursing staff that patient doesn't want to be restarted on home medications tonight. Patient is requesting medication to be scheduled for in the mornings. This Probation officer adjusted medication start time.

## 2020-05-24 NOTE — ED Notes (Signed)
Dash called for stat pickup   

## 2020-05-24 NOTE — Discharge Instructions (Signed)

## 2020-05-30 ENCOUNTER — Telehealth (HOSPITAL_COMMUNITY): Payer: Self-pay | Admitting: General Practice

## 2020-05-30 ENCOUNTER — Telehealth: Payer: Self-pay | Admitting: Family Medicine

## 2020-05-30 NOTE — Telephone Encounter (Signed)
Fax would not go through to [employer]. Emailed to [email or web address]

## 2020-05-30 NOTE — Telephone Encounter (Signed)
Care Management - Follow Up [facility] Discharges   Writer attempted to make contact with patient today and was unsuccessful.  Patient phone just and rang.  Unable to leave a voice mail message.

## 2020-06-26 NOTE — Progress Notes (Deleted)
Patient ID: Jorge Santos, male   DOB: 2000-10-02, 20 y.o.   MRN: [id number]   After ED visit 05/24/2020  From ED note: Paranoia [facility])  Generalized anxiety disorder  Moderate episode of recurrent major depressive disorder ([place])    Subjective: Patient upon follow-up assessment this morning was pleasant and cooperative with fair engagement. Patient stated he was able to sleep well over the night after being processed at the [facility] and speaking to the provider. Patient endorsed he has been suffering from increasingly worsening depressive mood over the last year with paranoid ideations and auditory visual hallucinations, though most recently, things have gotten worse since he lost his job in early January of this year and has been having increasing tensions at home with his mother-who he states he does not get along with. Patient endorsed he has been experiencing trouble sleeping (trouble falling asleep), lack of interest or pleasure in activities, feelings of worthlessness/hopelessness, and increased irritability. Patient denied suicidal and homicidal ideations, endorsed protective factors such as stating he could never harm himself, because he has to get himself together for his daughter and that he lives for her. Patient expressed frustrations with not being able to hold a job, stating his worsening mood and paranoia have been the cause, and he wants to "get better" for his daughter. Patient endorsed this has been over the last year he has been feeling paranoid about being around people and this has caused him to lose his job and have difficulties being productive, which he endorsed has further contributed to his depressive mood. Patient endorsed he believes he has been experiencing auditory and visual hallucinations, but denied currently experiencing them at the facility and during our engagement. Patient did not appear to be responding to internal stimuli. Patient expanded on what he perceived to be  auditory hallucinations stating "they would say negative things about stuff I was doing" and stated he has experienced visual hallucinations of "I would get really paranoid and I would sometimes feel I would see shadows". Patient endorsed he drinks "casually" in the evening, but two or three times a month he feels it gets "out of control". Patient additionally endorsed marijuana use. Patient when discussing previous medication history endorsed he stopped taking his medications because "I felt I didn't need them anymore, they were just for anger, and once I got myself into a good place-I stopped". Discussed with the patient resources for substance use, but patient endorsed he was not interested in resources for his use at this time stating "that's not a problem for me". Discussed with patient resources and options for treatment of his worsening depressive mood, paranoid ideations, and hallucinations. Patient endorsed he was not interested in being at the hospital any longer then he needed to be and that he was amenable to any recommendations the provider team could offer as far as outpatient services. Patient endorsed he was not seeking inpatient services, because he is more focused on finding a new job. Discussed outpatient resources and medications that could be beneficial that patient was amenable to. Patient endorsed he felt that with plan of care discussed that he could be safe to discharge and continue with outpatient services discussed. Patient gave permission to speak with his mother who was contacted for collateral information.   Per patients mother, the patient has been experiencing worsening depressive and irritable mood over the last several months and more recently further digression of his mood since the loss of his job. Patient's mother further states the patient has  been having increasing familial conflict with the patient's "baby momma" which has not helped. Per patient's mother the patient can  return back home and she does not report any safety concerns for the patient or herself. The patient's mother endorsed there are no dangerous weapons within the family home. Mother reports she can support the patient and will pick him up upon discharge from the [facility].   Stay Summary: Patient is a 20 year old male with past psychiatric history of DMDD, ODD, and GAD who arrived to the [facility] accompanied by law enforcement after he called them reporting that he felt unsafe. Patient upon arrival to the [facility] was assessed by the provider team and during evaluation endorsed worsening depressive mood, paranoid ideations, and hallucinations, without suicidal or homicidal ideations. Patient additionally endorsed recent loss of job due to worsening mental health. Patient endorsed alcohol and marijuana use regularly to cope with stressors and reported paranoid ideations. Patient held overnight with start of home medications to begin the following morning that patient was amenable to.   Patient reassessed the following morning by this Probation officer. Patient endorsed upon assessment continued depressive mood and feeling down, but denied suicidal and/or homicidal ideations, and endorse multiple protective factors. Patient denied auditory or visual hallucinations and denied paranoia. Discussed the patient's substance abuse and offered resources, but patient declined resources offered for this. Discussed treatment options for the patient's reported worsening mood, paranoid ideations, and hallucinations. Patient was amenable to restarting previous home medication and additional medications offered for sleep and anxiety. Medications prescribed 20 mg P.O. daily Celexa, buspar 5 mg P.O. BID, trazodone 50 mg P.O. as needed for sleep, and vistaril 25 mg P.O. as needed for anxiety. Patient given outpatient resources that he can follow-up with and patient to be picked-up by mother.

## 2020-06-27 ENCOUNTER — Inpatient Hospital Stay: Payer: Medicaid Other | Admitting: Physician Assistant

## 2020-07-22 ENCOUNTER — Encounter (HOSPITAL_COMMUNITY): Payer: Self-pay | Admitting: *Deleted

## 2020-07-22 ENCOUNTER — Emergency Department (HOSPITAL_COMMUNITY): Payer: Self-pay

## 2020-07-22 ENCOUNTER — Emergency Department (HOSPITAL_COMMUNITY)
Admission: EM | Admit: 2020-07-22 | Discharge: 2020-07-22 | Disposition: A | Discharge status: Home / Self Care | Payer: Self-pay | Attending: Emergency Medicine | Admitting: Emergency Medicine

## 2020-07-22 ENCOUNTER — Other Ambulatory Visit: Payer: Self-pay

## 2020-07-22 DIAGNOSIS — F17210 Nicotine dependence, cigarettes, uncomplicated: Secondary | ICD-10-CM | POA: Insufficient documentation

## 2020-07-22 DIAGNOSIS — R079 Chest pain, unspecified: Secondary | ICD-10-CM

## 2020-07-22 DIAGNOSIS — K219 Gastro-esophageal reflux disease without esophagitis: Secondary | ICD-10-CM | POA: Insufficient documentation

## 2020-07-22 DIAGNOSIS — R0789 Other chest pain: Secondary | ICD-10-CM | POA: Insufficient documentation

## 2020-07-22 LAB — COMPREHENSIVE METABOLIC PANEL
ALT: 28 U/L (ref 0–44)
AST: 22 U/L (ref 15–41)
Albumin: 4.2 g/dL (ref 3.5–5.0)
Alkaline Phosphatase: 64 U/L (ref 38–126)
Anion gap: 9 (ref 5–15)
BUN: 15 mg/dL (ref 6–20)
CO2: 26 mmol/L (ref 22–32)
Calcium: 9.8 mg/dL (ref 8.9–10.3)
Chloride: 103 mmol/L (ref 98–111)
Creatinine, Ser: 1.09 mg/dL (ref 0.61–1.24)
GFR, Estimated: >60 mL/min (ref >60)
Glucose, Bld: 101 mg/dL — ABNORMAL HIGH (ref 70–99)
Potassium: 4.4 mmol/L (ref 3.5–5.1)
Sodium: 138 mmol/L (ref 135–145)
Total Bilirubin: 0.6 mg/dL (ref 0.3–1.2)
Total Protein: 7.4 g/dL (ref 6.5–8.1)

## 2020-07-22 LAB — CBC WITH DIFFERENTIAL/PLATELET
Abs Immature Granulocytes: 0.04 10*3/uL (ref 0.00–0.07)
Basophils Absolute: 0 10*3/uL (ref 0.0–0.1)
Basophils Relative: 0 %
Eosinophils Absolute: 0.1 10*3/uL (ref 0.0–0.5)
Eosinophils Relative: 2 %
HCT: 50.5 % (ref 39.0–52.0)
Hemoglobin: 17.5 g/dL — ABNORMAL HIGH (ref 13.0–17.0)
Immature Granulocytes: 1 %
Lymphocytes Relative: 21 %
Lymphs Abs: 1.6 10*3/uL (ref 0.7–4.0)
MCH: 31.7 pg (ref 26.0–34.0)
MCHC: 34.7 g/dL (ref 30.0–36.0)
MCV: 91.5 fL (ref 80.0–100.0)
Monocytes Absolute: 0.8 10*3/uL (ref 0.1–1.0)
Monocytes Relative: 11 %
Neutro Abs: 4.9 10*3/uL (ref 1.7–7.7)
Neutrophils Relative %: 65 %
Platelets: 472 10*3/uL — ABNORMAL HIGH (ref 150–400)
RBC: 5.52 MIL/uL (ref 4.22–5.81)
RDW: 12.9 % (ref 11.5–15.5)
WBC: 7.5 10*3/uL (ref 4.0–10.5)
nRBC: 0 % (ref 0.0–0.2)

## 2020-07-22 LAB — D-DIMER, QUANTITATIVE: D-Dimer, Quant: <0.27 ug/mL-FEU (ref 0.00–0.50)

## 2020-07-22 LAB — TROPONIN I (HIGH SENSITIVITY)
Troponin I (High Sensitivity): 2 ng/L (ref <18)
Troponin I (High Sensitivity): 3 ng/L (ref <18)

## 2020-07-22 MED ORDER — NAPROXEN 500 MG PO TABS
500.0000 mg | ORAL_TABLET | Freq: Two times a day (BID) | ORAL | 0 refills | Status: DC
Start: 2020-07-22 — End: 2020-09-10

## 2020-07-22 NOTE — Discharge Instructions (Signed)
Your cardiac work-up today was normal. We recommend that you follow-up with a primary care doctor. Return here for new concerns.

## 2020-07-22 NOTE — ED Triage Notes (Signed)
Pt reports mid chest pain for several days. Increases with left arm movement and feels difficult to take a deep breath. No acute distress is noted at triage.

## 2020-07-22 NOTE — ED Triage Notes (Signed)
Emergency Medicine Provider Triage Evaluation Note  Jorge Santos , a 20 y.o. male  was evaluated in triage.  Pt complains of chest pain x10 days. Reports associated sob, but denies cough. Pain is worse with deep breath and movement of the left arm.   Review of Systems  Positive: Chest pain, sob Negative: Cough, fever  Physical Exam  BP 127/78 (BP Location: Left Arm)   Pulse 85   Temp 98.3 F (36.8 C) (Oral)   Resp 19   SpO2 100%  Gen:   Awake, no distress   HEENT:  Atraumatic  Resp:  Normal effort  Cardiac:  Normal rate  Abd:   Nondistended, nontender  MSK:   Moves extremities without difficulty  Neuro:  Speech clear   Medical Decision Making  Medically screening exam initiated at 6:19 PM.  Appropriate orders placed.  Jorge Santos was informed that the remainder of the evaluation will be completed by another provider, this initial triage assessment does not replace that evaluation, and the importance of remaining in the ED until their evaluation is complete.  Clinical Impression   20 y/o m presenting for chest pain  MSE was initiated and I personally evaluated the patient and placed orders (if any) at  6:23 PM on July 22, 2020.  The patient appears stable so that the remainder of the MSE may be completed by another provider.    Rodney Booze, [place] 07/22/20 1824

## 2020-07-22 NOTE — ED Provider Notes (Signed)
[place] EMERGENCY DEPARTMENT Provider Note   CSN: [id number] Arrival date & time: 07/22/20  1743     History Chief Complaint  Patient presents with  . Chest Pain    Jorge Santos is a 20 y.o. male.  The history is provided by the patient and medical records.  Chest Pain   20 y.o. M with hx of bipolar disorder, anxiety, ODD, ADHD, presenting to the ED for chest pain.  States this started about 10 days ago.  States he feels it in his left upper chest, especially with deep breathing.  States it feels sharp in nature when it occurs.  He states pain does "die down" some but never fully goes away.  He states he often feels like he is not getting all of his air in when he takes deep breaths which is not something that he has felt before.  He no longer smokes but does vape.  No known cardiac disease.  Does report recent trip to beach but that was after pain had already started.  He denies hx of DVT or PE.  History reviewed. No pertinent past medical history.  Patient Active Problem List   Diagnosis Date Noted  . Respiratory distress   . SOB (shortness of breath)   . Encounter for orogastric tube placement   . Hypoxia   . Anxiety disorder 10/24/2017  . Tobacco abuse   . Community acquired pneumonia 10/18/2017  . GERD without esophagitis 02/28/2017  . Attention deficit hyperactivity disorder (ADHD), combined type, moderate 07/08/2015  . Bipolar affective disorder ([place]) 07/08/2015  . Cannabis use disorder, moderate, in early remission, dependence ([place]) 07/08/2015  . Oppositional defiant disorder, moderate 07/08/2015    History reviewed. No pertinent surgical history.     History reviewed. No pertinent family history.  Social History   Tobacco Use  . Smoking status: Current Every Day Smoker    Packs/day: 0.50    Years: 4.00    Pack years: 2.00    Types: Cigarettes  . Smokeless tobacco: Former Systems developer    Types: Snuff  Vaping Use  . Vaping Use: Never  used  Substance Use Topics  . Alcohol use: No  . Drug use: Yes    Types: Marijuana    Comment:  Smokes about 5-6 times per week as of October 18, 2017    Home Medications Prior to Admission medications   Medication Sig Start Date End Date Taking? Authorizing Provider  busPIRone (BUSPAR) 5 MG tablet Take 1 tablet (5 mg total) by mouth 2 (two) times daily. 05/24/20   Money, Lowry Ram, FNP  citalopram (CELEXA) 20 MG tablet Take 1 tablet (20 mg total) by mouth daily. 05/24/20   Money, Lowry Ram, FNP  hydrOXYzine (ATARAX/VISTARIL) 25 MG tablet Take 1 tablet (25 mg total) by mouth every 6 (six) hours as needed for anxiety. 05/24/20   Money, Lowry Ram, FNP  traZODone (DESYREL) 50 MG tablet Take 1 tablet (50 mg total) by mouth at bedtime as needed for sleep. 05/24/20   Money, Lowry Ram, FNP    Allergies    Penicillins and Penicillins  Review of Systems   Review of Systems  Cardiovascular: Positive for chest pain.  All other systems reviewed and are negative.   Physical Exam Updated Vital Signs BP 130/87   Pulse 73   Temp 98.3 F (36.8 C) (Oral)   Resp (!) 28   SpO2 100%   Physical Exam Vitals and nursing note reviewed.  Constitutional:  Appearance: He is well-developed.  HENT:     Head: Normocephalic and atraumatic.  Eyes:     Conjunctiva/sclera: Conjunctivae normal.     Pupils: Pupils are equal, round, and reactive to light.  Cardiovascular:     Rate and Rhythm: Normal rate and regular rhythm.     Heart sounds: Normal heart sounds.  Pulmonary:     Effort: Pulmonary effort is normal.     Breath sounds: Normal breath sounds. No wheezing or rhonchi.  Chest:     Comments: Chest wall atraumatic in appearance, pain is not reproducible with palpation Abdominal:     General: Bowel sounds are normal.     Palpations: Abdomen is soft.  Musculoskeletal:        General: Normal range of motion.     Cervical back: Normal range of motion.  Skin:    General: Skin is warm and dry.   Neurological:     Mental Status: He is alert and oriented to person, place, and time.     ED Results / Procedures / Treatments   Labs (all labs ordered are listed, but only abnormal results are displayed) Labs Reviewed  COMPREHENSIVE METABOLIC PANEL - Abnormal; Notable for the following components:      Result Value   Glucose, Bld 101 (*)    All other components within normal limits  CBC WITH DIFFERENTIAL/PLATELET - Abnormal; Notable for the following components:   Hemoglobin 17.5 (*)    Platelets 472 (*)    All other components within normal limits  D-DIMER, QUANTITATIVE  TROPONIN I (HIGH SENSITIVITY)  TROPONIN I (HIGH SENSITIVITY)    EKG None  Radiology DG Chest 2 View  Result Date: 07/22/2020 CLINICAL DATA:  Chest pain EXAM: CHEST - 2 VIEW COMPARISON:  09/02/2019 FINDINGS: The heart size and mediastinal contours are within normal limits. Both lungs are clear. The visualized skeletal structures are unremarkable. IMPRESSION: No active cardiopulmonary disease. Electronically Signed   By: Donavan Foil M.D.   On: 07/22/2020 18:47    Procedures Procedures   Medications Ordered in ED Medications - No data to display  ED Course  I have reviewed the triage vital signs and the nursing notes.  Pertinent labs & imaging results that were available during my care of the patient were reviewed by me and considered in my medical decision making (see chart for details).    MDM Rules/Calculators/A&P  20 year old male here with 10 days of left upper chest pain.  This is pleuritic in nature and worse with deep breathing.  He denies any cough, fever, or upper respiratory symptoms.  Does report recent travel but symptoms began prior to his trip.  No history of DVT or PE.  He is afebrile and nontoxic.  His vitals are stable.  He was initially tachycardic on arrival but that is since resolved and heart rate is in the 80s during exam.  EKG with sinus tach and nonspecific changes.  Labs  reassuring including troponin x2.  I am unable to reproduce his pain with palpation of the chest wall.  Will add D-dimer.  D-dimer is negative.  Given reassuring work-up, lower suspicion for ACS, PE, dissection or other acute cardiac event at this time.  Feel he is stable for discharge home with PCP follow-up.  Return here for any new/acute changes.  Final Clinical Impression(s) / ED Diagnoses Final diagnoses:  Chest pain in adult    Rx / DC Orders ED Discharge Orders  Ordered    naproxen (NAPROSYN) 500 MG tablet  2 times daily with meals        07/22/20 2315           Larene Pickett, PA-C 07/22/20 2317    Quintella Reichert, MD 07/24/20 1630

## 2020-08-07 NOTE — Progress Notes (Signed)
Patient ID: Jorge Santos, male   DOB: [birth year], 20 y.o.   MRN: [id number]   Virtual Visit via Telephone Note  I connected with Jorge Santos on 08/08/20 at  2:10 PM EDT by telephone and verified that I am speaking with the correct person using two identifiers.  Location: Patient: home Provider: [facility] office   I discussed the limitations, risks, security and privacy concerns of performing an evaluation and management service by telephone and the availability of in person appointments. I also discussed with the patient that there may be a patient responsible charge related to this service. The patient expressed understanding and agreed to proceed.   History of Present Illness: After 07/22/2020 ED visit for CP and has had trouble with fleeting SI.  No plan or intent.  Says he is feeling better currently.  He has some paranoia and a lot of anxiety in public.  He was prescribed celexa, buspar 5mg  bid, trazadone for sleep and pren hydroxyzine.  He does use marijuana but denies other substances.  When he starts having suicidal thoughts he calls 911 or goes to the ED.  He was previously seen by psych but has no insurance.  CP resolved completely.  He does not have access to weapons.  Protective factors include his 74 year old daughter, friendships, and some family support.  He has not had to go back to the ED since he has been taking the meds I listed  20 y.o. M with hx of bipolar disorder, anxiety, ODD, ADHD, presenting to the ED for chest pain.  States this started about 10 days ago.  States he feels it in his left upper chest, especially with deep breathing.  States it feels sharp in nature when it occurs.  He states pain does "die down" some but never fully goes away.  He states he often feels like he is not getting all of his air in when he takes deep breaths which is not something that he has felt before.  He no longer smokes but does vape.  No known cardiac disease.  Does report recent trip to beach  but that was after pain had already started.  He denies hx of DVT or PE  From A/P: 20 year old male here with 10 days of left upper chest pain.  This is pleuritic in nature and worse with deep breathing.  He denies any cough, fever, or upper respiratory symptoms.  Does report recent travel but symptoms began prior to his trip.  No history of DVT or PE.  He is afebrile and nontoxic.  His vitals are stable.  He was initially tachycardic on arrival but that is since resolved and heart rate is in the 80s during exam.  EKG with sinus tach and nonspecific changes.  Labs reassuring including troponin x2.  I am unable to reproduce his pain with palpation of the chest wall.  Will add D-dimer.  D-dimer is negative.  Given reassuring work-up, lower suspicion for ACS, PE, dissection or other acute cardiac event at this time.  Feel he is stable for discharge home with PCP follow-up.  Return here for any new/acute changes.    Observations/Objective:  NAD.  TP linear.  J/I seems fair to good.  A&Ox3   Assessment and Plan: 1. Bipolar affective disorder, remission status unspecified ([place]) Stable currently - busPIRone (BUSPAR) 10 MG tablet; Take 1 tablet (10 mg total) by mouth 2 (two) times daily.  Dispense: 60 tablet; Refill: 2 - citalopram (CELEXA) 20 MG tablet; Take  1 tablet (20 mg total) by mouth daily.  Dispense: 30 tablet; Refill: 3 - Ambulatory referral to Psychiatry  2. Anxiety disorder, unspecified type Stable currently but having trouble going out so I will increase buspar dose - busPIRone (BUSPAR) 10 MG tablet; Take 1 tablet (10 mg total) by mouth 2 (two) times daily.  Dispense: 60 tablet; Refill: 2 - hydrOXYzine (ATARAX/VISTARIL) 25 MG tablet; Take 1 tablet (25 mg total) by mouth every 6 (six) hours as needed for anxiety.  Dispense: 30 tablet; Refill: 2 - Ambulatory referral to Psychiatry  3. Insomnia, unspecified type - traZODone (DESYREL) 50 MG tablet; Take 1 tablet (50 mg total) by mouth at  bedtime as needed for sleep.  Dispense: 30 tablet; Refill: 3  4. Encounter for examination following treatment at hospital - Ambulatory referral to Psychiatry  5. History of suicidal ideation Not currently.  He has a plan for how to get help if this recurs(911 or ED).  He has no plan or intent and several protective factors.  No acute risk issues. - Ambulatory referral to Psychiatry  I also discussed him coming by to get financial forms for OC or CD  Follow Up Instructions: Assign PCP in 6-8 weeks please   I discussed the assessment and treatment plan with the patient. The patient was provided an opportunity to ask questions and all were answered. The patient agreed with the plan and demonstrated an understanding of the instructions.   The patient was advised to call back or seek an in-person evaluation if the symptoms worsen or if the condition fails to improve as anticipated.  I provided 22 minutes of non-face-to-face time during this encounter.   Freeman Caldron, PA-C

## 2020-08-08 ENCOUNTER — Other Ambulatory Visit: Payer: Self-pay

## 2020-08-08 ENCOUNTER — Encounter: Payer: Self-pay | Admitting: Physician Assistant

## 2020-08-08 ENCOUNTER — Ambulatory Visit: Payer: Self-pay | Attending: Physician Assistant | Admitting: Physician Assistant

## 2020-08-08 DIAGNOSIS — Z09 Encounter for follow-up examination after completed treatment for conditions other than malignant neoplasm: Secondary | ICD-10-CM

## 2020-08-08 DIAGNOSIS — G4700 Insomnia, unspecified: Secondary | ICD-10-CM

## 2020-08-08 DIAGNOSIS — F419 Anxiety disorder, unspecified: Secondary | ICD-10-CM

## 2020-08-08 DIAGNOSIS — F319 Bipolar disorder, unspecified: Secondary | ICD-10-CM

## 2020-08-08 DIAGNOSIS — Z8659 Personal history of other mental and behavioral disorders: Secondary | ICD-10-CM

## 2020-08-08 MED ORDER — BUSPIRONE HCL 10 MG PO TABS: 10.0000 mg | ORAL_TABLET | Freq: Two times a day (BID) | ORAL | 2 refills | Status: DC

## 2020-08-08 MED ORDER — HYDROXYZINE HCL 25 MG PO TABS: 25.0000 mg | ORAL_TABLET | Freq: Four times a day (QID) | ORAL | 2 refills | Status: DC | PRN

## 2020-08-08 MED ORDER — TRAZODONE HCL 50 MG PO TABS: 50.0000 mg | ORAL_TABLET | Freq: Every evening | ORAL | 3 refills | Status: DC | PRN

## 2020-08-08 MED ORDER — CITALOPRAM HYDROBROMIDE 20 MG PO TABS
20.0000 mg | ORAL_TABLET | Freq: Every day | ORAL | 3 refills | Status: DC
Start: 2020-08-08 — End: 2020-10-03

## 2020-09-10 ENCOUNTER — Ambulatory Visit
Admission: EM | Admit: 2020-09-10 | Discharge: 2020-09-10 | Disposition: A | Discharge status: Home / Self Care | Payer: Medicaid Other | Attending: Emergency Medicine | Admitting: Emergency Medicine

## 2020-09-10 DIAGNOSIS — Z113 Encounter for screening for infections with a predominantly sexual mode of transmission: Secondary | ICD-10-CM | POA: Insufficient documentation

## 2020-09-10 DIAGNOSIS — R300 Dysuria: Secondary | ICD-10-CM | POA: Insufficient documentation

## 2020-09-10 DIAGNOSIS — L03116 Cellulitis of left lower limb: Secondary | ICD-10-CM | POA: Insufficient documentation

## 2020-09-10 LAB — POCT URINALYSIS DIP (MANUAL ENTRY)
Bilirubin, UA: NEGATIVE
Glucose, UA: NEGATIVE mg/dL
Ketones, POC UA: NEGATIVE mg/dL
Nitrite, UA: NEGATIVE
Protein Ur, POC: NEGATIVE mg/dL
Spec Grav, UA: <=1.005 — AB (ref 1.010–1.025)
Urobilinogen, UA: 0.2 E.U./dL
pH, UA: 6 (ref 5.0–8.0)

## 2020-09-10 MED ORDER — DOXYCYCLINE HYCLATE 100 MG PO CAPS: 100.0000 mg | ORAL_CAPSULE | Freq: Two times a day (BID) | ORAL | 0 refills | Status: AC

## 2020-09-10 NOTE — ED Triage Notes (Signed)
[place] stepped on what he thinks is a rock with his left foot causing a sudden onset of pain. No bleeding from the site at the time of injury. Used peroxide and ointment with no improvement, noting today the wound appears to be "oozing".  Approx 2-4 weeks of dysuria and "pressure" at the head of his penis after engaging in sexual intercourse. Confirms discharge.

## 2020-09-10 NOTE — Discharge Instructions (Addendum)
Begin doxycycline to treat infection as well as cover chlamydia Urethral swab pending and urine culture pending to further screen for STD/UTI Continue to monitor infection to foot, keep clean and dry Please follow-up if not improving or worsening

## 2020-09-12 LAB — CYTOLOGY, (ORAL, ANAL, URETHRAL) ANCILLARY ONLY
Chlamydia: POSITIVE — AB
Comment: NEGATIVE
Comment: NEGATIVE
Comment: NORMAL
Neisseria Gonorrhea: NEGATIVE
Trichomonas: NEGATIVE

## 2020-09-12 LAB — URINE CULTURE: Culture: NO GROWTH

## 2020-09-13 NOTE — ED Provider Notes (Signed)
[facility]    CSN: [id number] Arrival date & time: 09/10/20  1916      History   Chief Complaint Chief Complaint  Patient presents with   Foot Pain    Left    Dysuria    HPI Jorge Santos is a 20 y.o. male history of tobacco use presenting today for evaluation of wound to left foot and dysuria.  Reports that yesterday he excellently stepped on a rock causing a wound.  Noticed today redness and drainage to the area.  Denies fevers.  Denies concern for fracture otherwise, no pain beyond directly around wound.  Also reports dysuria and penile discharge over the past 2 to 4 weeks.  Reports pressure sensation at the tip of the penis.  Symptoms did begin after sexual intercourse.  HPI  History reviewed. No pertinent past medical history.  Patient Active Problem List   Diagnosis Date Noted   Respiratory distress    SOB (shortness of breath)    Encounter for orogastric tube placement    Hypoxia    Anxiety disorder 10/24/2017   Tobacco abuse    Community acquired pneumonia 10/18/2017   GERD without esophagitis 02/28/2017   Attention deficit hyperactivity disorder (ADHD), combined type, moderate 07/08/2015   Bipolar affective disorder ([place]) 07/08/2015   Cannabis use disorder, moderate, in early remission, dependence ([place]) 07/08/2015   Oppositional defiant disorder, moderate 07/08/2015    History reviewed. No pertinent surgical history.     Home Medications    Prior to Admission medications   Medication Sig Start Date End Date Taking? Authorizing Provider  doxycycline (VIBRAMYCIN) 100 MG capsule Take 1 capsule (100 mg total) by mouth 2 (two) times daily for 10 days. 09/10/20 09/20/20 Yes Cannen Dupras C, PA-C  busPIRone (BUSPAR) 10 MG tablet Take 1 tablet (10 mg total) by mouth 2 (two) times daily. 08/08/20   Argentina Donovan, PA-C  citalopram (CELEXA) 20 MG tablet Take 1 tablet (20 mg total) by mouth daily. 08/08/20   Argentina Donovan, PA-C  hydrOXYzine  (ATARAX/VISTARIL) 25 MG tablet Take 1 tablet (25 mg total) by mouth every 6 (six) hours as needed for anxiety. 08/08/20   Argentina Donovan, PA-C  traZODone (DESYREL) 50 MG tablet Take 1 tablet (50 mg total) by mouth at bedtime as needed for sleep. 08/08/20   Argentina Donovan, PA-C    Family History History reviewed. No pertinent family history.  Social History Social History   Tobacco Use   Smoking status: Every Day    Packs/day: 0.50    Years: 4.00    Pack years: 2.00    Types: Cigarettes   Smokeless tobacco: Former    Types: Snuff  Vaping Use   Vaping Use: Never used  Substance Use Topics   Alcohol use: No   Drug use: Yes    Types: Marijuana    Comment:  Smokes about 5-6 times per week as of October 18, 2017     Allergies   Penicillins and Penicillins   Review of Systems Review of Systems  Constitutional:  Negative for fatigue and fever.  Eyes:  Negative for redness, itching and visual disturbance.  Respiratory:  Negative for shortness of breath.   Cardiovascular:  Negative for chest pain and leg swelling.  Gastrointestinal:  Negative for nausea and vomiting.  Genitourinary:  Positive for dysuria and penile discharge.  Musculoskeletal:  Negative for arthralgias and myalgias.  Skin:  Positive for color change. Negative for rash and wound.  Neurological:  Negative for dizziness, syncope, weakness, light-headedness and headaches.    Physical Exam Triage Vital Signs ED Triage Vitals  Enc Vitals Group     BP 09/10/20 1939 (!) 141/97     Pulse Rate 09/10/20 1939 76     Resp 09/10/20 1939 18     Temp 09/10/20 1939 98.5 F (36.9 C)     Temp Source 09/10/20 1939 Oral     SpO2 09/10/20 1939 97 %     Weight --      Height --      Head Circumference --      Peak Flow --      Pain Score 09/10/20 1944 3     Pain Loc --      Pain Edu? --      Excl. in [place]? --    No data found.  Updated Vital Signs BP (!) 141/97 (BP Location: Right Arm)   Pulse 76   Temp 98.5 F  (36.9 C) (Oral)   Resp 18   SpO2 97%   Visual Acuity Right Eye Distance:   Left Eye Distance:   Bilateral Distance:    Right Eye Near:   Left Eye Near:    Bilateral Near:     Physical Exam Vitals and nursing note reviewed.  Constitutional:      Appearance: He is well-developed.     Comments: No acute distress  HENT:     Head: Normocephalic and atraumatic.     Nose: Nose normal.  Eyes:     Conjunctiva/sclera: Conjunctivae normal.  Cardiovascular:     Rate and Rhythm: Normal rate.  Pulmonary:     Effort: Pulmonary effort is normal. No respiratory distress.     Comments: Breathing comfortably at rest, CTABL, no wheezing, rales or other adventitious sounds auscultated  Abdominal:     General: There is no distension.  Musculoskeletal:        General: Normal range of motion.     Cervical back: Neck supple.  Skin:    General: Skin is warm and dry.     Comments: Puncture wound noted to plantar surface of left foot with surrounding erythema and erythema streaking proximally; wound appears slightly wet  Neurological:     Mental Status: He is alert and oriented to person, place, and time.     UC Treatments / Results  Labs (all labs ordered are listed, but only abnormal results are displayed) Labs Reviewed  POCT URINALYSIS DIP (MANUAL ENTRY) - Abnormal; Notable for the following components:      Result Value   Spec Grav, UA <=1.005 (*)    Blood, UA small (*)    Leukocytes, UA Small (1+) (*)    All other components within normal limits  CYTOLOGY, (ORAL, ANAL, URETHRAL) ANCILLARY ONLY - Abnormal; Notable for the following components:   Chlamydia Positive (*)    All other components within normal limits  URINE CULTURE    EKG   Radiology No results found.  Procedures Procedures (including critical care time)  Medications Ordered in UC Medications - No data to display  Initial Impression / Assessment and Plan / UC Course  I have reviewed the triage vital signs  and the nursing notes.  Pertinent labs & imaging results that were available during my care of the patient were reviewed by me and considered in my medical decision making (see chart for details).     Left foot wound-can present to plantar surface of left foot, concerning for cellulitis,  placing on doxycycline for dual coverage of possible STD as well.  If not improving with Doxy may consider switching to alternative antibiotic with better coverage of strep given associated streaking.  2.  Dysuria-20 year old male, suspect likely STD, urethral swab pending along with urine culture, will empirically treat for chlamydia today with doxycycline, deferring gonorrhea treatment given his penicillin allergy.  Discussed strict return precautions. Patient verbalized understanding and is agreeable with plan.   Final Clinical Impressions(s) / UC Diagnoses   Final diagnoses:  Cellulitis of left foot  Dysuria  Screen for STD (sexually transmitted disease)     Discharge Instructions      Begin doxycycline to treat infection as well as cover chlamydia Urethral swab pending and urine culture pending to further screen for STD/UTI Continue to monitor infection to foot, keep clean and dry Please follow-up if not improving or worsening   ED Prescriptions     Medication Sig Dispense Auth. Provider   doxycycline (VIBRAMYCIN) 100 MG capsule Take 1 capsule (100 mg total) by mouth 2 (two) times daily for 10 days. 20 capsule Cayla Wiegand, [place] C, PA-C      PDMP not reviewed this encounter.   Joneen Caraway Huslia C, [place] 09/13/20 [phone]

## 2020-10-03 ENCOUNTER — Other Ambulatory Visit: Payer: Self-pay

## 2020-10-03 ENCOUNTER — Telehealth: Payer: Self-pay | Admitting: Critical Care Medicine

## 2020-10-03 ENCOUNTER — Encounter: Payer: Self-pay | Admitting: Critical Care Medicine

## 2020-10-03 ENCOUNTER — Ambulatory Visit: Payer: Self-pay | Attending: Critical Care Medicine | Admitting: Critical Care Medicine

## 2020-10-03 VITALS — BP 110/73 | HR 75 | Ht 66.0 in | Wt 173.2 lb

## 2020-10-03 DIAGNOSIS — F419 Anxiety disorder, unspecified: Secondary | ICD-10-CM

## 2020-10-03 DIAGNOSIS — F319 Bipolar disorder, unspecified: Secondary | ICD-10-CM

## 2020-10-03 DIAGNOSIS — Z789 Other specified health status: Secondary | ICD-10-CM | POA: Insufficient documentation

## 2020-10-03 DIAGNOSIS — Z720 Tobacco use: Secondary | ICD-10-CM

## 2020-10-03 DIAGNOSIS — F1090 Alcohol use, unspecified, uncomplicated: Secondary | ICD-10-CM | POA: Insufficient documentation

## 2020-10-03 DIAGNOSIS — Z7289 Other problems related to lifestyle: Secondary | ICD-10-CM

## 2020-10-03 MED ORDER — CITALOPRAM HYDROBROMIDE 40 MG PO TABS
40.0000 mg | ORAL_TABLET | Freq: Every day | ORAL | 2 refills | Status: DC
  Filled 2020-10-03: qty 60, 60d supply, fill #0

## 2020-10-03 MED ORDER — BUSPIRONE HCL 10 MG PO TABS
10.0000 mg | ORAL_TABLET | Freq: Two times a day (BID) | ORAL | 2 refills | Status: DC
  Filled 2020-10-03: qty 60, 30d supply, fill #0

## 2020-10-03 MED ORDER — HYDROXYZINE HCL 50 MG PO TABS
25.0000 mg | ORAL_TABLET | Freq: Three times a day (TID) | ORAL | 1 refills | Status: DC | PRN
  Filled 2020-10-03: qty 60, 40d supply, fill #0

## 2020-10-03 MED ORDER — THIAMINE HCL 100 MG PO TABS
100.0000 mg | ORAL_TABLET | Freq: Every day | ORAL | 3 refills | Status: DC
  Filled 2020-10-03: qty 30, 30d supply, fill #0

## 2020-10-03 MED ORDER — FOLIC ACID 1 MG PO TABS
1.0000 mg | ORAL_TABLET | Freq: Every day | ORAL | 2 refills | Status: DC
Start: 2020-10-03 — End: 2021-08-31
  Filled 2020-10-03: qty 30, 30d supply, fill #0

## 2020-10-03 NOTE — Progress Notes (Signed)
Discuss medications

## 2020-10-03 NOTE — Assessment & Plan Note (Signed)
We discussed briefly tobacco use cessation

## 2020-10-03 NOTE — Patient Instructions (Signed)
Increase citalopram to 40 mg daily Refills on your other medications sent to our pharmacy Stop trazodone Please go to the outpatient open access mental health clinic at [facility] behavioral health per the instructions we gave you go Monday through Thursday between 8 and 11 AM if you arrive by 7:30 AM its first come first serve open access she will be more likely seen quicker  Focus on reduction of your alcohol content  I did increase the hydroxyzine to 3 times daily 50 mg as needed for anxiety and continue the BuSpar as prescribed  Full set of screening labs were sent today  Return to see Dr. Joya Gaskins 2 months  Our licensed clinical social worker will contact you as well and follow-up for behavioral health

## 2020-10-03 NOTE — Telephone Encounter (Signed)
Referral to [place] clinic made Pls f/u as well with face to face counseling

## 2020-10-03 NOTE — Assessment & Plan Note (Signed)
Ongoing alcohol use Counseling given Start thiamine and folic acid supplementation Referral to behavioral counseling in behalf mental health made

## 2020-10-03 NOTE — Assessment & Plan Note (Signed)
Plan will be to increase citalopram to 40 mg daily increase hydroxyzine to 50 mg 3 times a day as needed continue BuSpar twice a day discontinue trazodone as it was causing side effects  We will connect him with our clinical social worker and refer him to [facility] for open access day

## 2020-10-03 NOTE — Progress Notes (Signed)
New Patient Office Visit  Subjective:  Patient ID: Jorge Santos, male    DOB: 07-Jun-2000  Age: 20 y.o. MRN: [id number]  CC:  Chief Complaint  Patient presents with   Establish Care    HPI Jorge Santos presents for primary care to establish Patient was seen in the emergency room in June for left foot cellulitis we had injured his foot while swimming.  The patient has completely healed from this and is no longer having difficulties.  He also presented with some mild dysuria and had an STD screen positive for chlamydia treated with doxycycline with resolution of symptoms.  Patient also complains of increased alcohol use up to 12 cans of beer daily.  He does this to treat stress.  He has history of chronic anxiety and depression has been on citalopram 20 mg a day and BuSpar 10 mg twice daily hydroxyzine as needed.  The patient has seen mental health in the [facility] system in the past not followed recently.  The patient has been self-medicating with alcohol he does not use any other substances but did previously use marijuana no longer using this.  He has trouble focusing he cannot get his mind off certain things and is easily angered and has been diagnosed with defiant behavior disorder.  Patient does not have insurance and will be looking to obtain the orange card Below is documentation of the visit with our physician associate [place] in May Saw [place] 08/2020 After 07/22/2020 ED visit for CP and has had trouble with fleeting SI.  No plan or intent.  Says he is feeling better currently.  He has some paranoia and a lot of anxiety in public.  He was prescribed celexa, buspar 5mg  bid, trazadone for sleep and pren hydroxyzine.  He does use marijuana but denies other substances.  When he starts having suicidal thoughts he calls 911 or goes to the ED.  He was previously seen by psych but has no insurance.  CP resolved completely.  He does not have access to weapons.  Protective factors include his  71 year old daughter, friendships, and some family support.  He has not had to go back to the ED since he has been taking the meds I listed   20 y.o. M with hx of bipolar disorder, anxiety, ODD, ADHD, presenting to the ED for chest pain.  States this started about 10 days ago.  States he feels it in his left upper chest, especially with deep breathing.  States it feels sharp in nature when it occurs.  He states pain does "die down" some but never fully goes away.  He states he often feels like he is not getting all of his air in when he takes deep breaths which is not something that he has felt before.  He no longer smokes but does vape.  No known cardiac disease.  Does report recent trip to beach but that was after pain had already started.  He denies hx of DVT or PE   From A/P: 20 year old male here with 10 days of left upper chest pain.  This is pleuritic in nature and worse with deep breathing.  He denies any cough, fever, or upper respiratory symptoms.  Does report recent travel but symptoms began prior to his trip.  No history of DVT or PE.  He is afebrile and nontoxic.  His vitals are stable.  He was initially tachycardic on arrival but that is since resolved and heart rate is in the  80s during exam.  EKG with sinus tach and nonspecific changes.  Labs reassuring including troponin x2.  I am unable to reproduce his pain with palpation of the chest wall.  Will add D-dimer.   D-dimer is negative.  Given reassuring work-up, lower suspicion for ACS, PE, dissection or other acute cardiac event at this time.  Feel he is stable for discharge home with PCP follow-up.  Return here for any new/acute changes.   1. Bipolar affective disorder, remission status unspecified ([place]) Stable currently - busPIRone (BUSPAR) 10 MG tablet; Take 1 tablet (10 mg total) by mouth 2 (two) times daily.  Dispense: 60 tablet; Refill: 2 - citalopram (CELEXA) 20 MG tablet; Take 1 tablet (20 mg total) by mouth daily.  Dispense: 30  tablet; Refill: 3 - Ambulatory referral to Psychiatry   2. Anxiety disorder, unspecified type Stable currently but having trouble going out so I will increase buspar dose - busPIRone (BUSPAR) 10 MG tablet; Take 1 tablet (10 mg total) by mouth 2 (two) times daily.  Dispense: 60 tablet; Refill: 2 - hydrOXYzine (ATARAX/VISTARIL) 25 MG tablet; Take 1 tablet (25 mg total) by mouth every 6 (six) hours as needed for anxiety.  Dispense: 30 tablet; Refill: 2 - Ambulatory referral to Psychiatry   3. Insomnia, unspecified type - traZODone (DESYREL) 50 MG tablet; Take 1 tablet (50 mg total) by mouth at bedtime as needed for sleep.  Dispense: 30 tablet; Refill: 3   4. Encounter for examination following treatment at hospital - Ambulatory referral to Psychiatry   5. History of suicidal ideation Not currently.  He has a plan for how to get help if this recurs(911 or ED).  He has no plan or intent and several protective factors.  No acute risk issues. - Ambulatory referral to Psychiatry  The patient never got in with psychiatry He does not have active suicidality at this visit   Past Medical History:  Diagnosis Date   Cannabis use disorder, moderate, in early remission, dependence ([place]) 07/08/2015    History reviewed. No pertinent surgical history.  History reviewed. No pertinent family history.  Social History   Socioeconomic History   Marital status: Single    Spouse name: Not on file   Number of children: Not on file   Years of education: Not on file   Highest education level: Not on file  Occupational History   Not on file  Tobacco Use   Smoking status: Every Day    Packs/day: 0.50    Years: 4.00    Pack years: 2.00    Types: Cigarettes   Smokeless tobacco: Former    Types: Snuff  Vaping Use   Vaping Use: Never used  Substance and Sexual Activity   Alcohol use: No   Drug use: Yes    Types: Marijuana    Comment:  Smokes about 5-6 times per week as of October 18, 2017   Sexual  activity: Yes  Other Topics Concern   Not on file  Social History Narrative   ** Merged History Encounter **       Social Determinants of Health   Financial Resource Strain: Not on file  Food Insecurity: Not on file  Transportation Needs: Not on file  Physical Activity: Not on file  Stress: Not on file  Social Connections: Not on file  Intimate Partner Violence: Not on file    ROS Review of Systems  HENT: Negative.    Respiratory: Negative.    Cardiovascular: Negative.  Gastrointestinal: Negative.   Genitourinary: Negative.   Musculoskeletal: Negative.   Neurological: Negative.   Psychiatric/Behavioral:  Positive for decreased concentration and dysphoric mood. The patient is nervous/anxious and is hyperactive.        12 cans beer per day  Triggers easily    Objective:   Today's Vitals: BP 110/73   Pulse 75   Ht 5\' 6"  (1.676 m)   Wt 173 lb 3.2 oz (78.6 kg)   SpO2 96%   BMI 27.96 kg/m   Physical Exam Constitutional:      Appearance: Normal appearance. He is normal weight.  HENT:     Head: Normocephalic and atraumatic.     Nose: Nose normal.     Mouth/Throat:     Mouth: Mucous membranes are moist.     Pharynx: Oropharynx is clear.  Eyes:     Extraocular Movements: Extraocular movements intact.     Conjunctiva/sclera: Conjunctivae normal.     Pupils: Pupils are equal, round, and reactive to light.  Cardiovascular:     Rate and Rhythm: Normal rate and regular rhythm.     Pulses: Normal pulses.     Heart sounds: Normal heart sounds.  Pulmonary:     Effort: Pulmonary effort is normal.     Breath sounds: Normal breath sounds.  Abdominal:     General: Abdomen is flat. Bowel sounds are normal.     Palpations: Abdomen is soft.  Musculoskeletal:        General: Normal range of motion.     Cervical back: Normal range of motion and neck supple.  Skin:    General: Skin is warm and dry.  Neurological:     General: No focal deficit present.     Mental Status:  He is alert and oriented to person, place, and time. Mental status is at baseline.  Psychiatric:        Attention and Perception: Attention and perception normal. He is attentive. He does not perceive auditory or visual hallucinations.        Mood and Affect: Mood normal.        Speech: Speech normal.        Behavior: Behavior normal. Behavior is cooperative.        Thought Content: Thought content normal.        Cognition and Memory: Cognition normal.        Judgment: Judgment normal.    Assessment & Plan:   Problem List Items Addressed This Visit       Other   Tobacco abuse    We discussed briefly tobacco use cessation       Anxiety disorder    Plan will be to increase citalopram to 40 mg daily increase hydroxyzine to 50 mg 3 times a day as needed continue BuSpar twice a day discontinue trazodone as it was causing side effects  We will connect him with our clinical social worker and refer him to [facility] for open access day       Relevant Medications   citalopram (CELEXA) 40 MG tablet   hydrOXYzine (ATARAX/VISTARIL) 50 MG tablet   busPIRone (BUSPAR) 10 MG tablet   Bipolar affective disorder (HCC)   Relevant Medications   citalopram (CELEXA) 40 MG tablet   busPIRone (BUSPAR) 10 MG tablet   Alcohol use - Primary    Ongoing alcohol use Counseling given Start thiamine and folic acid supplementation Referral to behavioral counseling in behalf mental health made  Relevant Orders   Comprehensive metabolic panel   CBC with Differential/Platelet    Outpatient Encounter Medications as of 10/03/2020  Medication Sig   folic acid (FOLVITE) 1 MG tablet Take 1 tablet (1 mg total) by mouth daily.   thiamine 100 MG tablet Take 1 tablet (100 mg total) by mouth daily.   [DISCONTINUED] busPIRone (BUSPAR) 10 MG tablet Take 1 tablet (10 mg total) by mouth 2 (two) times daily.   [DISCONTINUED] citalopram (CELEXA) 20 MG tablet Take 1 tablet (20 mg  total) by mouth daily.   [DISCONTINUED] hydrOXYzine (ATARAX/VISTARIL) 25 MG tablet Take 1 tablet (25 mg total) by mouth every 6 (six) hours as needed for anxiety.   [DISCONTINUED] traZODone (DESYREL) 50 MG tablet Take 1 tablet (50 mg total) by mouth at bedtime as needed for sleep.   busPIRone (BUSPAR) 10 MG tablet Take 1 tablet (10 mg total) by mouth 2 (two) times daily.   citalopram (CELEXA) 40 MG tablet Take 1 tablet (40 mg total) by mouth daily.   hydrOXYzine (ATARAX/VISTARIL) 50 MG tablet Take 0.5 tablets (25 mg total) by mouth every 8 (eight) hours as needed for anxiety.   No facility-administered encounter medications on file as of 10/03/2020.  Will obtain primary care screening labs  Follow-up: Return in about 2 months (around 12/03/2020).   Asencion Noble, MD

## 2020-10-04 LAB — COMPREHENSIVE METABOLIC PANEL
ALT: 38 IU/L (ref 0–44)
AST: 30 IU/L (ref 0–40)
Albumin/Globulin Ratio: 2 (ref 1.2–2.2)
Albumin: 4.8 g/dL (ref 4.1–5.2)
Alkaline Phosphatase: 85 IU/L (ref 44–121)
BUN/Creatinine Ratio: 13 (ref 9–20)
BUN: 13 mg/dL (ref 6–20)
Bilirubin Total: 0.2 mg/dL (ref 0.0–1.2)
CO2: 23 mmol/L (ref 20–29)
Calcium: 10.1 mg/dL (ref 8.7–10.2)
Chloride: 98 mmol/L (ref 96–106)
Creatinine, Ser: 0.98 mg/dL (ref 0.76–1.27)
Globulin, Total: 2.4 g/dL (ref 1.5–4.5)
Glucose: 84 mg/dL (ref 65–99)
Potassium: 5.3 mmol/L — ABNORMAL HIGH (ref 3.5–5.2)
Sodium: 138 mmol/L (ref 134–144)
Total Protein: 7.2 g/dL (ref 6.0–8.5)
eGFR: 112 mL/min/{1.73_m2} (ref >59)

## 2020-10-04 LAB — CBC WITH DIFFERENTIAL/PLATELET
Basophils Absolute: 0.1 10*3/uL (ref 0.0–0.2)
Basos: 1 %
EOS (ABSOLUTE): 0.3 10*3/uL (ref 0.0–0.4)
Eos: 4 %
Hematocrit: 49.5 % (ref 37.5–51.0)
Hemoglobin: 16.5 g/dL (ref 13.0–17.7)
Immature Grans (Abs): 0.1 10*3/uL (ref 0.0–0.1)
Immature Granulocytes: 2 %
Lymphocytes Absolute: 1.8 10*3/uL (ref 0.7–3.1)
Lymphs: 26 %
MCH: 31.3 pg (ref 26.6–33.0)
MCHC: 33.3 g/dL (ref 31.5–35.7)
MCV: 94 fL (ref 79–97)
Monocytes Absolute: 0.8 10*3/uL (ref 0.1–0.9)
Monocytes: 12 %
Neutrophils Absolute: 3.9 10*3/uL (ref 1.4–7.0)
Neutrophils: 55 %
Platelets: 410 10*3/uL (ref 150–450)
RBC: 5.28 x10E6/uL (ref 4.14–5.80)
RDW: 13 % (ref 11.6–15.4)
WBC: 6.9 10*3/uL (ref 3.4–10.8)

## 2020-10-04 NOTE — Telephone Encounter (Signed)
Pt mentioned that Tuesday 10/08/20 is best so scheduled pt for that day at 11:00AM at [facility]

## 2020-10-08 ENCOUNTER — Ambulatory Visit (INDEPENDENT_AMBULATORY_CARE_PROVIDER_SITE_OTHER): Payer: Medicaid Other | Admitting: Clinical

## 2020-10-08 ENCOUNTER — Other Ambulatory Visit: Payer: Self-pay

## 2020-10-08 ENCOUNTER — Telehealth: Payer: Self-pay

## 2020-10-08 DIAGNOSIS — F411 Generalized anxiety disorder: Secondary | ICD-10-CM

## 2020-10-08 DIAGNOSIS — F3181 Bipolar II disorder: Secondary | ICD-10-CM

## 2020-10-08 DIAGNOSIS — F3171 Bipolar disorder, in partial remission, most recent episode hypomanic: Secondary | ICD-10-CM

## 2020-10-08 DIAGNOSIS — R41840 Attention and concentration deficit: Secondary | ICD-10-CM

## 2020-10-08 NOTE — Telephone Encounter (Signed)
Contacted pt to go over lab results pt is aware and doesn't have any questions or concerns 
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
## 2020-10-10 ENCOUNTER — Ambulatory Visit: Payer: Medicaid Other

## 2020-10-14 ENCOUNTER — Other Ambulatory Visit: Payer: Self-pay

## 2020-10-14 ENCOUNTER — Ambulatory Visit (INDEPENDENT_AMBULATORY_CARE_PROVIDER_SITE_OTHER): Payer: No Payment, Other | Admitting: Psychiatry

## 2020-10-14 ENCOUNTER — Encounter (HOSPITAL_COMMUNITY): Payer: Self-pay | Admitting: Psychiatry

## 2020-10-14 VITALS — BP 139/92 | HR 80 | Temp 98.2°F | Ht 67.0 in | Wt 172.0 lb

## 2020-10-14 DIAGNOSIS — F902 Attention-deficit hyperactivity disorder, combined type: Secondary | ICD-10-CM

## 2020-10-14 DIAGNOSIS — F419 Anxiety disorder, unspecified: Secondary | ICD-10-CM | POA: Diagnosis not present

## 2020-10-14 DIAGNOSIS — F319 Bipolar disorder, unspecified: Secondary | ICD-10-CM

## 2020-10-14 DIAGNOSIS — F1090 Alcohol use, unspecified, uncomplicated: Secondary | ICD-10-CM

## 2020-10-14 DIAGNOSIS — Z789 Other specified health status: Secondary | ICD-10-CM

## 2020-10-14 DIAGNOSIS — Z7289 Other problems related to lifestyle: Secondary | ICD-10-CM

## 2020-10-14 MED ORDER — BUSPIRONE HCL 10 MG PO TABS
10.0000 mg | ORAL_TABLET | Freq: Three times a day (TID) | ORAL | 2 refills | Status: DC
  Filled 2020-10-14 (×2): qty 90, 30d supply, fill #0
  Filled 2020-12-07: qty 90, 30d supply, fill #1

## 2020-10-14 MED ORDER — QUETIAPINE FUMARATE 50 MG PO TABS
50.0000 mg | ORAL_TABLET | Freq: Every day | ORAL | 2 refills | Status: DC
  Filled 2020-10-14: qty 30, 30d supply, fill #0

## 2020-10-14 MED ORDER — ATOMOXETINE HCL 40 MG PO CAPS
40.0000 mg | ORAL_CAPSULE | Freq: Every day | ORAL | 2 refills | Status: DC
  Filled 2020-10-14: qty 30, 30d supply, fill #0

## 2020-10-14 MED ORDER — CITALOPRAM HYDROBROMIDE 40 MG PO TABS
40.0000 mg | ORAL_TABLET | Freq: Every day | ORAL | 2 refills | Status: DC
  Filled 2020-10-14 – 2020-12-07 (×2): qty 60, 60d supply, fill #0
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
## 2020-10-22 ENCOUNTER — Encounter: Payer: Self-pay | Admitting: Clinical

## 2020-10-22 NOTE — Progress Notes (Signed)
Patient did not show for appointment.   

## 2020-10-29 ENCOUNTER — Other Ambulatory Visit: Payer: Self-pay

## 2020-10-29 ENCOUNTER — Ambulatory Visit (INDEPENDENT_AMBULATORY_CARE_PROVIDER_SITE_OTHER): Payer: Self-pay | Admitting: Clinical

## 2020-10-29 DIAGNOSIS — F1010 Alcohol abuse, uncomplicated: Secondary | ICD-10-CM

## 2020-10-29 DIAGNOSIS — F3181 Bipolar II disorder: Secondary | ICD-10-CM

## 2020-10-29 DIAGNOSIS — F411 Generalized anxiety disorder: Secondary | ICD-10-CM

## 2020-10-29 DIAGNOSIS — F902 Attention-deficit hyperactivity disorder, combined type: Secondary | ICD-10-CM
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
## 2020-11-11 ENCOUNTER — Other Ambulatory Visit: Payer: Self-pay

## 2020-11-12 ENCOUNTER — Ambulatory Visit: Payer: Medicaid Other | Admitting: Clinical

## 2020-11-12 ENCOUNTER — Other Ambulatory Visit: Payer: Self-pay

## 2020-11-12 ENCOUNTER — Other Ambulatory Visit (HOSPITAL_COMMUNITY): Payer: Self-pay | Admitting: Psychiatry

## 2020-11-12 ENCOUNTER — Telehealth (HOSPITAL_COMMUNITY): Payer: Self-pay | Admitting: Psychiatry

## 2020-11-12 DIAGNOSIS — F319 Bipolar disorder, unspecified: Secondary | ICD-10-CM

## 2020-11-12 DIAGNOSIS — F902 Attention-deficit hyperactivity disorder, combined type: Secondary | ICD-10-CM

## 2020-11-12 MED ORDER — ATOMOXETINE HCL 80 MG PO CAPS
80.0000 mg | ORAL_CAPSULE | Freq: Every day | ORAL | 2 refills | Status: DC
  Filled 2020-11-12: qty 30, 30d supply, fill #0
  Filled 2020-12-12: qty 30, 30d supply, fill #1

## 2020-11-12 MED ORDER — ARIPIPRAZOLE 5 MG PO TABS
5.0000 mg | ORAL_TABLET | Freq: Every day | ORAL | 2 refills | Status: DC
  Filled 2020-11-12: qty 30, 30d supply, fill #0

## 2020-11-12 NOTE — Telephone Encounter (Signed)
Patient notes that he is having problems concentrating, is distractible, forgetful, and avoids tasks that are mentally taxing.  He also notes that he dislikes Seroquel because it causes him to have an increased appetite.  He notes that he was taking it nightly but had vivid nightmares and reports that he switched to taking it in the daytime which was ineffective.  Today he is agreeable to discontinuing Seroquel 50 mg and starting Abilify 5 mg to help manage mood.  He will increase Strattera 40 mg to 80 mg to help with symptoms of ADHD. Potential side effects of medication and risks vs benefits of treatment vs non-treatment were explained and discussed. All questions were answered.  No other concerns at this time.

## 2020-11-13 ENCOUNTER — Other Ambulatory Visit: Payer: Self-pay

## 2020-11-14 ENCOUNTER — Other Ambulatory Visit: Payer: Self-pay

## 2020-11-20 ENCOUNTER — Other Ambulatory Visit: Payer: Self-pay

## 2020-11-22 ENCOUNTER — Telehealth (HOSPITAL_COMMUNITY): Payer: Self-pay | Admitting: *Deleted

## 2020-11-22 ENCOUNTER — Other Ambulatory Visit (HOSPITAL_COMMUNITY): Payer: Self-pay | Admitting: Psychiatry

## 2020-11-22 ENCOUNTER — Other Ambulatory Visit: Payer: Self-pay

## 2020-11-22 MED ORDER — CAPLYTA 42 MG PO CAPS
42.0000 mg | ORAL_CAPSULE | Freq: Every day | ORAL | 2 refills | Status: DC
Start: 2020-11-22 — End: 2021-01-14
  Filled 2020-11-22: qty 30, 30d supply, fill #0
  Filled 2020-12-30: qty 60, 60d supply, fill #1

## 2020-11-22 NOTE — Telephone Encounter (Signed)
Call from patient stating since he recently started new medicine, he thinks it may be from the Abilify he states his "face feels weird" tongue feels weird and it is twitching. Will make Dr Ronne Binning aware of his concerns and will make an effort to call him back today with drs recommendations.

## 2020-11-22 NOTE — Telephone Encounter (Signed)
Patient notes that while on Abilify he has had facial twitching, a heavy tongue, and stuttering. He notes that his mood has somewhat improved but does endorse symptoms of hypomania such as irritability, distractibility, racing thoughts, grandiosity, and poor sleep. Provider informed patient that his symptoms sounds like drug induced EPS and recommended patient discontinue Abilify. Provider recommended Caplyta 42 mg to help stabilize mood which he was agreeable to. If this regimen is unsuccessful Lamictal may be trailed. No other concerns noted at this time.

## 2020-11-26 ENCOUNTER — Ambulatory Visit: Payer: Medicaid Other | Admitting: Clinical

## 2020-12-10 ENCOUNTER — Other Ambulatory Visit: Payer: Self-pay

## 2020-12-12 ENCOUNTER — Other Ambulatory Visit: Payer: Self-pay

## 2020-12-23 ENCOUNTER — Other Ambulatory Visit: Payer: Self-pay

## 2020-12-26 ENCOUNTER — Other Ambulatory Visit: Payer: Self-pay

## 2020-12-30 ENCOUNTER — Other Ambulatory Visit: Payer: Self-pay

## 2021-01-14 ENCOUNTER — Encounter (HOSPITAL_COMMUNITY): Payer: Self-pay | Admitting: Psychiatry

## 2021-01-14 ENCOUNTER — Other Ambulatory Visit: Payer: Self-pay

## 2021-01-14 ENCOUNTER — Telehealth (INDEPENDENT_AMBULATORY_CARE_PROVIDER_SITE_OTHER): Payer: No Payment, Other | Admitting: Psychiatry

## 2021-01-14 DIAGNOSIS — F319 Bipolar disorder, unspecified: Secondary | ICD-10-CM | POA: Diagnosis not present

## 2021-01-14 DIAGNOSIS — F419 Anxiety disorder, unspecified: Secondary | ICD-10-CM

## 2021-01-14 DIAGNOSIS — F902 Attention-deficit hyperactivity disorder, combined type: Secondary | ICD-10-CM

## 2021-01-14 MED ORDER — CAPLYTA 42 MG PO CAPS
42.0000 mg | ORAL_CAPSULE | Freq: Every day | ORAL | 3 refills | Status: DC
  Filled 2021-01-14: qty 30, 30d supply, fill #0

## 2021-01-14 MED ORDER — GABAPENTIN 300 MG PO CAPS
300.0000 mg | ORAL_CAPSULE | Freq: Three times a day (TID) | ORAL | 3 refills | Status: DC
  Filled 2021-01-14: qty 90, 30d supply, fill #0

## 2021-01-14 MED ORDER — BUSPIRONE HCL 10 MG PO TABS
10.0000 mg | ORAL_TABLET | Freq: Three times a day (TID) | ORAL | 3 refills | Status: DC
  Filled 2021-01-14: qty 90, 30d supply, fill #0

## 2021-01-14 MED ORDER — CITALOPRAM HYDROBROMIDE 40 MG PO TABS
40.0000 mg | ORAL_TABLET | Freq: Every day | ORAL | 3 refills | Status: DC
  Filled 2021-01-14: qty 60, 60d supply, fill #0

## 2021-01-14 MED ORDER — ATOMOXETINE HCL 80 MG PO CAPS
80.0000 mg | ORAL_CAPSULE | Freq: Every day | ORAL | 3 refills | Status: DC
  Filled 2021-01-14: qty 60, 60d supply, fill #0

## 2021-01-14 NOTE — Progress Notes (Signed)
[place] MD/PA/NP OP Progress Note Virtual Visit via Video Note  I connected with Jorge Santos on 01/14/21 at  1:30 PM EDT by a video enabled telemedicine application and verified that I am speaking with the correct person using two identifiers.  Location: Patient: Home Provider: Clinic   I discussed the limitations of evaluation and management by telemedicine and the availability of in person appointments. The patient expressed understanding and agreed to proceed.  I provided 30 minutes of non-face-to-face time during this encounter.   01/14/2021 2:01 PM Jorge Santos  MRN:  [id number]  Chief Complaint: "I have been restless"  HPI: 20 year old male seen today for follow up psychiatric evaluation.   He has a psychiatric history of anxiety, depression, bipolar affective disorder, ADHD, ODD, marijuana use, alcohol use, and tobacco dependence.  He is currently managed on Celexa 40 mg daily, hydroxyzine 25 mg every 8 hours, Caplyta 42 mg daily and BuSpar 10 mg three daily.  He notes his medications are somewhat effective in managing his psychiatric conditions.  Today he is well-groomed, pleasant, cooperative, engaged in conversation, and maintained eye contact.  He informed Probation officer that since his last visit he has been restless.  He notes that he is also more anxious and notes that he worries about his health, his child, and his friendships.  He notes that recently he has been more paranoid because he and his friend stopped talking.  He notes that he believes that his friend is out to get him.  He also endorses symptoms of hypomania such as irritability, fluctuations in mood, racing thoughts, and poor sleep.  He notes that he sleeps approximately 5 hours nightly.  Today he denies SI/HI/VAH, mania, or paranoia.  Provider asked patient if he continues to consume marijuana and alcohol.  He notes that he smokes marijuana twice weekly and drinks alcohol once monthly.  Provider informed patient that the  substances can exacerbate his mental health.  He endorsed understanding and notes that he would try to reduce his consumption more.  Patient informed Probation officer that his anxiety is bothersome.  Provider conducted a GAD-7 and P scored an 18, at his last visit he scored a 14.  Provider also conducted PHQ-9 and patient scored a 9, at his last visit he scored 14.  He notes that his appetite is adequate.  Today he denies SI/HI/VAH.  Today he is agreeable to starting gabapentin 300 mg 3 times daily to help manage mood, anxiety, and sleep.  Potential side effects of medication and risks vs benefits of treatment vs non-treatment were explained and discussed. All questions were answered.Patient will continue all other medications as prescribed.  No other concerns at this time. Visit Diagnosis:    ICD-10-CM   1. Attention deficit hyperactivity disorder (ADHD), combined type, moderate  F90.2 atomoxetine (STRATTERA) 80 MG capsule    2. Bipolar affective disorder, remission status unspecified (HCC)  F31.9 lumateperone tosylate (CAPLYTA) 42 MG capsule    citalopram (CELEXA) 40 MG tablet    busPIRone (BUSPAR) 10 MG tablet    gabapentin (NEURONTIN) 300 MG capsule    3. Anxiety disorder, unspecified type  F41.9 busPIRone (BUSPAR) 10 MG tablet    gabapentin (NEURONTIN) 300 MG capsule      Past Psychiatric History: Anxiety, depression, ADHD, bipolar affective disorder, ODD, tobacco use, and alcohol use  Past Medical History:  Past Medical History:  Diagnosis Date   Cannabis use disorder, moderate, in early remission, dependence ([place]) 07/08/2015   No past surgical history on file.  Family Psychiatric History: Brother ODD, ADHD, sister depression, anxiety, and marijuana use  Family History: No family history on file.  Social History:  Social History   Socioeconomic History   Marital status: Single    Spouse name: Not on file   Number of children: Not on file   Years of education: Not on file   Highest  education level: Not on file  Occupational History   Not on file  Tobacco Use   Smoking status: Every Day    Packs/day: 0.50    Years: 4.00    Pack years: 2.00    Types: Cigarettes, E-cigarettes   Smokeless tobacco: Former    Types: Snuff  Vaping Use   Vaping Use: Never used  Substance and Sexual Activity   Alcohol use: Yes    Alcohol/week: 48.0 standard drinks    Types: 48 Cans of beer per week    Comment: cutting back, was 12 pack per day.   Drug use: Not Currently    Types: Marijuana    Comment: Uses CBD but quit smoking marijuana for last 2 months.   Sexual activity: Yes  Other Topics Concern   Not on file  Social History Narrative   ** Merged History Encounter **       Social Determinants of Health   Financial Resource Strain: Not on file  Food Insecurity: Not on file  Transportation Needs: Not on file  Physical Activity: Not on file  Stress: Not on file  Social Connections: Not on file    Allergies:  Allergies  Allergen Reactions   Penicillins Anaphylaxis    Has patient had a PCN reaction causing immediate rash, facial/tongue/throat swelling, SOB or lightheadedness with hypotension: YES Has patient had a PCN reaction causing severe rash involving mucus membranes or skin necrosis: Unknown Has patient had a PCN reaction that required hospitalization: No Has patient had a PCN reaction occurring within the last 10 years: Yes 2019 If all of the above answers are "NO", then may proceed with Cephalosporin use.   Penicillins     Metabolic Disorder Labs: Lab Results  Component Value Date   HGBA1C 5.3 05/24/2020   MPG 105.41 05/24/2020   No results found for: PROLACTIN Lab Results  Component Value Date   CHOL 216 (H) 05/24/2020   TRIG 413 (H) 05/24/2020   HDL 55 05/24/2020   CHOLHDL 3.9 05/24/2020   VLDL UNABLE TO CALCULATE IF TRIGLYCERIDE OVER 400 mg/dL 05/24/2020   LDLCALC UNABLE TO CALCULATE IF TRIGLYCERIDE OVER 400 mg/dL 05/24/2020   Lab Results   Component Value Date   TSH 1.038 05/24/2020    Therapeutic Level Labs: No results found for: LITHIUM No results found for: VALPROATE No components found for:  CBMZ  Current Medications: Current Outpatient Medications  Medication Sig Dispense Refill   gabapentin (NEURONTIN) 300 MG capsule Take 1 capsule (300 mg total) by mouth 3 (three) times daily. 90 capsule 3   atomoxetine (STRATTERA) 80 MG capsule Take 1 capsule (80 mg total) by mouth daily. 30 capsule 3   busPIRone (BUSPAR) 10 MG tablet Take 1 tablet (10 mg total) by mouth 3 (three) times daily. 90 tablet 3   citalopram (CELEXA) 40 MG tablet Take 1 tablet (40 mg total) by mouth daily. 60 tablet 3   folic acid (FOLVITE) 1 MG tablet Take 1 tablet (1 mg total) by mouth daily. 30 tablet 2   lumateperone tosylate (CAPLYTA) 42 MG capsule Take 1 capsule (42 mg total) by mouth daily. [place]  capsule 3   thiamine 100 MG tablet Take 1 tablet (100 mg total) by mouth daily. 30 tablet 3   No current facility-administered medications for this visit.     Musculoskeletal: Strength & Muscle Tone:  Unable to assess due to telehealth visit [place]:  Unable to assess due to telehealth visit Patient leans: N/A  Psychiatric Specialty Exam: Review of Systems  There were no vitals taken for this visit.There is no height or weight on file to calculate BMI.  General Appearance: Well Groomed  Eye Contact:  Good  Speech:  Clear and Coherent and Normal Rate  Volume:  Normal  Mood:  Anxious and Depressed  Affect:  Appropriate and Congruent  Thought Process:  Coherent, Goal Directed, and Linear  Orientation:  Full (Time, Place, and Person)  Thought Content: WDL and Logical   Suicidal Thoughts:  No  Homicidal Thoughts:  No  Memory:  Immediate;   Good Recent;   Good Remote;   Good  Judgement:  Good  Insight:  Good  Psychomotor Activity:  Normal  Concentration:  Concentration: Good and Attention Span: Good  Recall:  Good  Fund of Knowledge:  Good  Language: Good  Akathisia:  No  Handed:  Right  AIMS (if indicated): not done  Assets:  Communication Skills Desire for Improvement Financial Resources/Insurance Housing Intimacy Social Support  ADL's:  Intact  Cognition: WNL  Sleep:  Fair   Screenings: AUDIT    Flowsheet Row Office Visit from 10/14/2020 in [facility]  Alcohol Use Disorder Identification Test Final Score (AUDIT) 15      GAD-7    Flowsheet Row Video Visit from 01/14/2021 in [place] from 10/29/2020 in Primary Care at [place] Visit from 10/14/2020 in [place] from 10/08/2020 in Primary Care at [place] Visit from 10/03/2020 in [place]  Total GAD-7 Score 18 16 20 18 20       PHQ2-9    Flowsheet Row Video Visit from 01/14/2021 in [employer] from 10/29/2020 in Primary Care at [place] Visit from 10/14/2020 in [place] from 10/08/2020 in Primary Care at [place] Visit from 10/03/2020 in [place]  PHQ-2 Total Score 2 2 2 1 4   PHQ-9 Total Score 9 15 14 13 17       Flowsheet Row ED from 09/10/2020 in [facility]  ED from 07/22/2020 in [place] ED from 05/24/2020 in [place] No Risk No Risk No Risk        Assessment and Plan: Patient endorses symptoms of anxiety, depression, and hypomania. Today he is agreeable to agreeable to starting gabapentin 300 mg 3 times daily to help manage mood, anxiety, and sleep.  1. Attention deficit hyperactivity disorder (ADHD), combined type, moderate  Continue- atomoxetine  (STRATTERA) 80 MG capsule; Take 1 capsule (80 mg total) by mouth daily.  Dispense: 30 capsule; Refill: 3  2. Bipolar affective disorder, remission status unspecified ([place])  Continue- lumateperone tosylate (CAPLYTA) 42 MG capsule; Take 1 capsule (42 mg total) by mouth daily.  Dispense: 30 capsule; Refill: 3 Continue- citalopram (CELEXA) 40 MG tablet; Take 1 tablet (40 mg total) by mouth daily.  Dispense: 60 tablet; Refill:  3 Continue- busPIRone (BUSPAR) 10 MG tablet; Take 1 tablet (10 mg total) by mouth 3 (three) times daily.  Dispense: 90 tablet; Refill: 3 Start- gabapentin (NEURONTIN) 300 MG capsule; Take 1 capsule (300 mg total) by mouth 3 (three) times daily.  Dispense: 90 capsule; Refill: 3  3. Anxiety disorder, unspecified type  Continue- busPIRone (BUSPAR) 10 MG tablet; Take 1 tablet (10 mg total) by mouth 3 (three) times daily.  Dispense: 90 tablet; Refill: 3 Start- gabapentin (NEURONTIN) 300 MG capsule; Take 1 capsule (300 mg total) by mouth 3 (three) times daily.  Dispense: 90 capsule; Refill: 3  Follow up in 3 months  Salley Slaughter, NP 01/14/2021, 2:01 PM

## 2021-01-15 ENCOUNTER — Other Ambulatory Visit: Payer: Self-pay

## 2021-02-19 ENCOUNTER — Other Ambulatory Visit: Payer: Self-pay

## 2021-02-19 ENCOUNTER — Other Ambulatory Visit (HOSPITAL_COMMUNITY): Payer: Self-pay | Admitting: Psychiatry

## 2021-02-19 ENCOUNTER — Telehealth (HOSPITAL_COMMUNITY): Payer: Self-pay | Admitting: *Deleted

## 2021-02-19 DIAGNOSIS — F419 Anxiety disorder, unspecified: Secondary | ICD-10-CM

## 2021-02-19 DIAGNOSIS — F902 Attention-deficit hyperactivity disorder, combined type: Secondary | ICD-10-CM

## 2021-02-19 DIAGNOSIS — F319 Bipolar disorder, unspecified: Secondary | ICD-10-CM

## 2021-02-19 MED ORDER — ATOMOXETINE HCL 80 MG PO CAPS
80.0000 mg | ORAL_CAPSULE | Freq: Every day | ORAL | 3 refills | Status: DC
  Filled 2021-02-19: qty 30, 30d supply, fill #0
  Filled 2021-04-01: qty 90, 90d supply, fill #0

## 2021-02-19 MED ORDER — BUSPIRONE HCL 10 MG PO TABS
10.0000 mg | ORAL_TABLET | Freq: Three times a day (TID) | ORAL | 3 refills | Status: DC
  Filled 2021-02-19: qty 90, 30d supply, fill #0
  Filled 2021-03-28: qty 90, 30d supply, fill #1

## 2021-02-19 MED ORDER — CITALOPRAM HYDROBROMIDE 40 MG PO TABS
40.0000 mg | ORAL_TABLET | Freq: Every day | ORAL | 3 refills | Status: DC
  Filled 2021-02-19: qty 30, 30d supply, fill #0
  Filled 2021-03-28: qty 30, 30d supply, fill #1

## 2021-02-19 MED ORDER — LAMOTRIGINE 25 MG PO TABS
25.0000 mg | ORAL_TABLET | Freq: Every day | ORAL | 1 refills | Status: DC
  Filled 2021-02-19: qty 120, 64d supply, fill #0

## 2021-02-19 MED ORDER — CAPLYTA 42 MG PO CAPS
42.0000 mg | ORAL_CAPSULE | Freq: Every day | ORAL | 3 refills | Status: DC
  Filled 2021-02-19: qty 30, 30d supply, fill #0

## 2021-02-19 MED ORDER — GABAPENTIN 300 MG PO CAPS
300.0000 mg | ORAL_CAPSULE | Freq: Three times a day (TID) | ORAL | 3 refills | Status: DC
  Filled 2021-02-19: qty 90, 30d supply, fill #0

## 2021-02-19 NOTE — Telephone Encounter (Signed)
Patient informed writer that he has been having symptoms of hypomania such as irritability, distractibility, fluctuations in mood, paranoia, and impulsive spending.  He notes that while at work he is unable to concentrate because of the above and requested medication adjustments as he feels like they are ineffective at this time.  Today he is agreeable to starting Lamictal.  Patient was instructed to take 1 tablet (25) by mouth daily for 2 weeks, increase to 2 tablets (50 mg) on November 30, increased to 3 tablets (75 mg) on December 14, and increased to 4 tablets (100 mg) on December 24. Potential side effects of medication and risks vs benefits of treatment vs non-treatment were explained and discussed. All questions were answered.  Provider informed patient that if he has side effects (Stevens-Johnson syndrome) to discontinue it and go to the emergency room.  Provider also informed patient that he can call writer if side effects occur.  He endorsed understanding and agreed.  No adjustments made to patient's ADHD medications at this time however they potentially will be at next visit on 04/16/2021.  Patient also requested that provider refilled his medications as he does not have refills.  Provider was agreeable to this request.

## 2021-02-19 NOTE — Telephone Encounter (Signed)
Call from patient wanting to speak to his provider, Dr Ronne Binning re his medication for his "bipolarism and his ADHD medicine." States he continues to get upset over the littlest things and cant focus at work and will mess something up because his mind wanders. WIll let Dr Ronne Binning know he would like to speak with her.

## 2021-02-20 ENCOUNTER — Other Ambulatory Visit: Payer: Self-pay

## 2021-02-21 ENCOUNTER — Other Ambulatory Visit: Payer: Self-pay

## 2021-03-03 ENCOUNTER — Other Ambulatory Visit: Payer: Self-pay

## 2021-03-19 ENCOUNTER — Other Ambulatory Visit: Payer: Self-pay

## 2021-03-19 ENCOUNTER — Telehealth (HOSPITAL_COMMUNITY): Payer: Self-pay | Admitting: Psychiatry

## 2021-03-19 NOTE — Telephone Encounter (Signed)
Please return call to [facility] at Patient Assistance [phone]. Linda request to speak with Collie Siad.

## 2021-04-01 ENCOUNTER — Other Ambulatory Visit: Payer: Self-pay

## 2021-04-02 ENCOUNTER — Other Ambulatory Visit: Payer: Self-pay

## 2021-04-16 ENCOUNTER — Telehealth (INDEPENDENT_AMBULATORY_CARE_PROVIDER_SITE_OTHER): Payer: No Payment, Other | Admitting: Psychiatry

## 2021-04-16 ENCOUNTER — Other Ambulatory Visit: Payer: Self-pay

## 2021-04-16 ENCOUNTER — Encounter (HOSPITAL_COMMUNITY): Payer: Self-pay | Admitting: Psychiatry

## 2021-04-16 DIAGNOSIS — F319 Bipolar disorder, unspecified: Secondary | ICD-10-CM

## 2021-04-16 DIAGNOSIS — F902 Attention-deficit hyperactivity disorder, combined type: Secondary | ICD-10-CM | POA: Diagnosis not present

## 2021-04-16 DIAGNOSIS — F419 Anxiety disorder, unspecified: Secondary | ICD-10-CM

## 2021-04-16 MED ORDER — CITALOPRAM HYDROBROMIDE 40 MG PO TABS
40.0000 mg | ORAL_TABLET | Freq: Every day | ORAL | 3 refills | Status: DC
  Filled 2021-04-16: qty 60, 60d supply, fill #0

## 2021-04-16 MED ORDER — BUSPIRONE HCL 15 MG PO TABS
15.0000 mg | ORAL_TABLET | Freq: Three times a day (TID) | ORAL | 3 refills | Status: DC
  Filled 2021-04-16: qty 90, 30d supply, fill #0

## 2021-04-16 MED ORDER — LAMOTRIGINE 100 MG PO TABS
100.0000 mg | ORAL_TABLET | Freq: Every day | ORAL | 3 refills | Status: DC
  Filled 2021-04-16: qty 30, 30d supply, fill #0

## 2021-04-16 MED ORDER — GUANFACINE HCL ER 1 MG PO TB24
1.0000 mg | ORAL_TABLET | Freq: Every day | ORAL | 3 refills | Status: DC
  Filled 2021-04-16: qty 30, 30d supply, fill #0

## 2021-04-16 NOTE — Progress Notes (Signed)
[place] MD/PA/NP OP Progress Note Virtual Visit via Telephone Note  I connected with Jorge Santos on 04/16/21 at 10:00 AM EST by telephone and verified that I am speaking with the correct person using two identifiers.  Location: Patient: home Provider: Clinic   I discussed the limitations, risks, security and privacy concerns of performing an evaluation and management service by telephone and the availability of in person appointments. I also discussed with the patient that there may be a patient responsible charge related to this service. The patient expressed understanding and agreed to proceed.   I provided 30 minutes of non-face-to-face time during this encounter.   04/16/2021 9:51 AM Jorge Santos  MRN:  [id number]  Chief Complaint: "I have been doing alright but I stopped a lot of my medications"  HPI: 21 year old male seen today for follow up psychiatric evaluation.   He has a psychiatric history of anxiety, depression, bipolar affective disorder, ADHD, ODD, marijuana use, alcohol use, and tobacco dependence.  He is currently managed on Lamictal 100 mg, Celexa 40 mg daily, hydroxyzine 25 mg every 8 hours, Caplyta 42 mg daily and BuSpar 10 mg three daily.  He informed Probation officer that he discontinued  gabapentin (thought it would cause cancer/found ineffective), hydroxyzine, Caplyta (found ineffective), Strattera (found ineffective and caused nausea).  He notes that his other medications are somewhat effective in managing his psychiatric conditions.  Today patient was unable to logon virtually so his assessment was done on the phone.  During exam he was pleasant, cooperative, and engaged in conversation.  He informed Probation officer that since starting Lamictal he has been less irritable and impulsive.  He informed Probation officer that he is now trying to save money.  He also notes that he does not feel paranoid or have racing thoughts.  Patient does Artist that he continues to suffer from symptoms of ADHD  if he discontinued Strattera due to feeling nauseous and finding it ineffective.  He reports while at work he is not able to focus on completing task, is distractible, restless, fidgety, and at times is forgetful.  Since his last visit he reports that his anxiety and depression has improved.  Today provider conducted a GAD-7 and patient scored a 12, at his last visit he scored an 18.  Provider also conducted PHQ-9 and patient scored a 10, at his last visit he scored a 9.  He endorses adequate sleep and appetite.  Today he denies SI/HI/VAH or mania.  Patient notes that he has cut his consumption of marijuana down to 1-2 times a week.  He notes that on his birthday he consumed more alcohol but notes that he is trying not to overindulge with alcohol use.  Today he is agreeable to starting Intuniv 1 mg to help manage symptoms of ADHD.  Potential side effects of medication and risks vs benefits of treatment vs non-treatment were explained and discussed. All questions were answered.At this time he does not want to restart hydroxyzine, Caplyta, gabapentin, or Strattera.  He will continue all other medications as prescribed.  Visit Diagnosis:    ICD-10-CM   1. Attention deficit hyperactivity disorder (ADHD), combined type, moderate  F90.2 guanFACINE (INTUNIV) 1 MG TB24 ER tablet    2. Bipolar affective disorder, remission status unspecified (HCC)  F31.9 busPIRone (BUSPAR) 15 MG tablet    citalopram (CELEXA) 40 MG tablet    lamoTRIgine (LAMICTAL) 100 MG tablet    3. Anxiety disorder, unspecified type  F41.9 busPIRone (BUSPAR) 15 MG tablet  Past Psychiatric History: Anxiety, depression, ADHD, bipolar affective disorder, ODD, tobacco use, and alcohol use  Past Medical History:  Past Medical History:  Diagnosis Date   Cannabis use disorder, moderate, in early remission, dependence ([place]) 07/08/2015   History reviewed. No pertinent surgical history.  Family Psychiatric History: Brother ODD, ADHD,  sister depression, anxiety, and marijuana use  Family History: History reviewed. No pertinent family history.  Social History:  Social History   Socioeconomic History   Marital status: Single    Spouse name: Not on file   Number of children: Not on file   Years of education: Not on file   Highest education level: Not on file  Occupational History   Not on file  Tobacco Use   Smoking status: Every Day    Packs/day: 0.50    Years: 4.00    Pack years: 2.00    Types: Cigarettes, E-cigarettes   Smokeless tobacco: Former    Types: Snuff  Vaping Use   Vaping Use: Never used  Substance and Sexual Activity   Alcohol use: Yes    Alcohol/week: 48.0 standard drinks    Types: 48 Cans of beer per week    Comment: cutting back, was 12 pack per day.   Drug use: Not Currently    Types: Marijuana    Comment: Uses CBD but quit smoking marijuana for last 2 months.   Sexual activity: Yes  Other Topics Concern   Not on file  Social History Narrative   ** Merged History Encounter **       Social Determinants of Health   Financial Resource Strain: Not on file  Food Insecurity: Not on file  Transportation Needs: Not on file  Physical Activity: Not on file  Stress: Not on file  Social Connections: Not on file    Allergies:  Allergies  Allergen Reactions   Penicillins Anaphylaxis    Has patient had a PCN reaction causing immediate rash, facial/tongue/throat swelling, SOB or lightheadedness with hypotension: YES Has patient had a PCN reaction causing severe rash involving mucus membranes or skin necrosis: Unknown Has patient had a PCN reaction that required hospitalization: No Has patient had a PCN reaction occurring within the last 10 years: Yes 2019 If all of the above answers are "NO", then may proceed with Cephalosporin use.   Penicillins     Metabolic Disorder Labs: Lab Results  Component Value Date   HGBA1C 5.3 05/24/2020   MPG 105.41 05/24/2020   No results found for:  PROLACTIN Lab Results  Component Value Date   CHOL 216 (H) 05/24/2020   TRIG 413 (H) 05/24/2020   HDL 55 05/24/2020   CHOLHDL 3.9 05/24/2020   VLDL UNABLE TO CALCULATE IF TRIGLYCERIDE OVER 400 mg/dL 05/24/2020   LDLCALC UNABLE TO CALCULATE IF TRIGLYCERIDE OVER 400 mg/dL 05/24/2020   Lab Results  Component Value Date   TSH 1.038 05/24/2020    Therapeutic Level Labs: No results found for: LITHIUM No results found for: VALPROATE No components found for:  CBMZ  Current Medications: Current Outpatient Medications  Medication Sig Dispense Refill   guanFACINE (INTUNIV) 1 MG TB24 ER tablet Take 1 tablet (1 mg total) by mouth daily. 30 tablet 3   busPIRone (BUSPAR) 15 MG tablet Take 1 tablet (15 mg total) by mouth 3 (three) times daily. 90 tablet 3   citalopram (CELEXA) 40 MG tablet Take 1 tablet (40 mg total) by mouth daily. 60 tablet 3   folic acid (FOLVITE) 1 MG tablet Take 1  tablet (1 mg total) by mouth daily. 30 tablet 2   lamoTRIgine (LAMICTAL) 100 MG tablet Take 1 tablet (100 mg total) by mouth daily. Take 1 tablet (100) by mouth daily f 30 tablet 3   thiamine 100 MG tablet Take 1 tablet (100 mg total) by mouth daily. 30 tablet 3   No current facility-administered medications for this visit.     Musculoskeletal: Strength & Muscle Tone:  Unable to assess due to telehealth visit [place]:  Unable to assess due to telehealth visit Patient leans: N/A  Psychiatric Specialty Exam: Review of Systems  There were no vitals taken for this visit.There is no height or weight on file to calculate BMI.  General Appearance: Well Groomed  Eye Contact:  Good  Speech:  Clear and Coherent and Normal Rate  Volume:  Normal  Mood:  Anxious and Depressed  Affect:  Appropriate and Congruent  Thought Process:  Coherent, Goal Directed, and Linear  Orientation:  Full (Time, Place, and Person)  Thought Content: WDL and Logical   Suicidal Thoughts:  No  Homicidal Thoughts:  No  Memory:   Immediate;   Good Recent;   Good Remote;   Good  Judgement:  Good  Insight:  Good  Psychomotor Activity:  Normal  Concentration:  Concentration: Good and Attention Span: Good  Recall:  Good  Fund of Knowledge: Good  Language: Good  Akathisia:  No  Handed:  Right  AIMS (if indicated): not done  Assets:  Communication Skills Desire for Improvement Financial Resources/Insurance Housing Intimacy Social Support  ADL's:  Intact  Cognition: WNL  Sleep:  Fair   Screenings: AUDIT    Flowsheet Row [facility] Visit [facility] 10/14/2020 in [facility]  Alcohol Use Disorder Identification Test Final Score (AUDIT) 15      GAD-7    Flowsheet Row Video Visit from 04/16/2021 in [facility] Video Visit from 01/14/2021 in [place] from 10/29/2020 in Primary Care at [place] Visit from 10/14/2020 in [place] from 10/08/2020 in Primary Care at [facility]  Total GAD-7 Score 12 18 16 20 18       PHQ2-9    Flowsheet Row Video Visit from 04/16/2021 in [facility] Video Visit from 01/14/2021 in [place] from 10/29/2020 in Primary Care at [place] Visit from 10/14/2020 in [place] from 10/08/2020 in Primary Care at [facility]  PHQ-2 Total Score 1 2 2 2 1   PHQ-9 Total Score 10 9 15 14 13       Flowsheet Row ED from 09/10/2020 in [facility]  ED from 07/22/2020 in [place] [facility] from 05/24/2020 in [place] No Risk No Risk No Risk        Assessment and Plan: Patient notes that his hypomania, anxiety, and depression has improved.  He however  notes that his ADHD has not. Today he is agreeable to starting Intuniv 1 mg to help manage symptoms of ADHD.  At this time he does not want to restart hydroxyzine, Caplyta, gabapentin, or Strattera.  He will continue all other medications as prescribed.  1. Bipolar affective disorder, remission status unspecified (HCC)  Continue- busPIRone (BUSPAR) 15 MG tablet; Take 1 tablet (15 mg total)  by mouth 3 (three) times daily.  Dispense: 90 tablet; Refill: 3 Continue- citalopram (CELEXA) 40 MG tablet; Take 1 tablet (40 mg total) by mouth daily.  Dispense: 60 tablet; Refill: 3 Continue- lamoTRIgine (LAMICTAL) 100 MG tablet; Take 1 tablet (100 mg total) by mouth daily.  Dispense: 30 tablet; Refill: 3  2. Anxiety disorder, unspecified type  Continue- busPIRone (BUSPAR) 15 MG tablet; Take 1 tablet (15 mg total) by mouth 3 (three) times daily.  Dispense: 90 tablet; Refill: 3  3. Attention deficit hyperactivity disorder (ADHD), combined type, moderate  Start- guanFACINE (INTUNIV) 1 MG TB24 ER tablet; Take 1 tablet (1 mg total) by mouth daily.  Dispense: 30 tablet; Refill: 3   Follow up in 3 months  Salley Slaughter, NP 04/16/2021, 9:51 AM

## 2021-04-17 ENCOUNTER — Other Ambulatory Visit: Payer: Self-pay

## 2021-04-18 ENCOUNTER — Other Ambulatory Visit: Payer: Self-pay

## 2021-05-05 ENCOUNTER — Telehealth (HOSPITAL_COMMUNITY): Payer: Self-pay | Admitting: *Deleted

## 2021-05-05 NOTE — Telephone Encounter (Signed)
Provider started pt on Intuitive and pt reports it makes him nauseated and weak or drained.  Other medication increased dosage buspirone only helps a little, not a lot.

## 2021-05-05 NOTE — Telephone Encounter (Signed)
Patient called requested to speak to provider only. Call back  # [phone]

## 2021-05-06 NOTE — Telephone Encounter (Signed)
Patient informed Probation officer that Intuniv makes him nauseous and drained.  At this time he notes that he discontinued it two days ago.  He reports that BuSpar has been somewhat effective since increasing it to 15 mg 3 times a day.  At this time no medication adjustments made.  Patient will not restart Intuniv and continue BuSpar as prescribed.  Medication adjustments can be discussed at next visit if needed.  No other concerns at this time.

## 2021-05-19 ENCOUNTER — Other Ambulatory Visit: Payer: Self-pay

## 2021-06-26 ENCOUNTER — Encounter (HOSPITAL_COMMUNITY): Payer: Self-pay

## 2021-06-26 ENCOUNTER — Telehealth (HOSPITAL_COMMUNITY): Payer: No Payment, Other | Admitting: Psychiatry

## 2021-06-26 ENCOUNTER — Other Ambulatory Visit: Payer: Self-pay

## 2021-08-31 ENCOUNTER — Telehealth: Payer: Medicaid Other | Admitting: Family

## 2021-08-31 DIAGNOSIS — J019 Acute sinusitis, unspecified: Secondary | ICD-10-CM

## 2021-08-31 MED ORDER — DOXYCYCLINE HYCLATE 100 MG PO TABS: 100.0000 mg | ORAL_TABLET | Freq: Two times a day (BID) | ORAL | 0 refills | Status: AC

## 2021-08-31 NOTE — Progress Notes (Signed)
Virtual Visit Consent   Jorge Santos, you are scheduled for a virtual visit with a [facility] provider today. Just as with appointments in the office, your consent must be obtained to participate. Your consent will be active for this visit and any virtual visit you may have with one of our providers in the next 365 days. If you have a MyChart account, a copy of this consent can be sent to you electronically.  As this is a virtual visit, video technology does not allow for your provider to perform a traditional examination. This may limit your provider's ability to fully assess your condition. If your provider identifies any concerns that need to be evaluated in person or the need to arrange testing (such as labs, EKG, etc.), we will make arrangements to do so. Although advances in technology are sophisticated, we cannot ensure that it will always work on either your end or our end. If the connection with a video visit is poor, the visit may have to be switched to a telephone visit. With either a video or telephone visit, we are not always able to ensure that we have a secure connection.  By engaging in this virtual visit, you consent to the provision of healthcare and authorize for your insurance to be billed (if applicable) for the services provided during this visit. Depending on your insurance coverage, you may receive a charge related to this service.  I need to obtain your verbal consent now. Are you willing to proceed with your visit today? Jorge Santos has provided verbal consent on 08/31/2021 for a virtual visit (video or telephone). Evelina Dun, FNP  Date: 08/31/2021 3:25 PM  Virtual Visit via Video Note   I, Evelina Dun, connected with  Jorge Santos  ([id number], March 02, 2001) on 08/31/21 at  3:30 PM EDT by a video-enabled telemedicine application and verified that I am speaking with the correct person using two identifiers.  Location: Patient: Virtual Visit Location Patient:  Home Provider: Virtual Visit Location Provider: Home Office   I discussed the limitations of evaluation and management by telemedicine and the availability of in person appointments. The patient expressed understanding and agreed to proceed.    History of Present Illness: Jorge Santos is a 21 y.o. who identifies as a male who was assigned male at birth, and is being seen today for sinus problems for the last two weeks that has worsen.  HPI: Sinusitis This is a new problem. The current episode started 1 to 4 weeks ago. The problem has been gradually worsening since onset. There has been no fever. His pain is at a severity of 9/10. The pain is moderate. Associated symptoms include congestion, ear pain, headaches, a hoarse voice, sinus pressure, sneezing and a sore throat (improved). Pertinent negatives include no chills, coughing or shortness of breath. Past treatments include oral decongestants. The treatment provided mild relief.   Problems:  Patient Active Problem List   Diagnosis Date Noted   Alcohol use 10/03/2020   Anxiety disorder 10/24/2017   Tobacco abuse    Attention deficit hyperactivity disorder (ADHD), combined type, moderate 07/08/2015   Bipolar affective disorder ([place]) 07/08/2015   Oppositional defiant disorder, moderate 07/08/2015    Allergies:  Allergies  Allergen Reactions   Penicillins Anaphylaxis    Has patient had a PCN reaction causing immediate rash, facial/tongue/throat swelling, SOB or lightheadedness with hypotension: YES Has patient had a PCN reaction causing severe rash involving mucus membranes or skin necrosis: Unknown Has patient had a PCN reaction  that required hospitalization: No Has patient had a PCN reaction occurring within the last 10 years: Yes 2019 If all of the above answers are "NO", then may proceed with Cephalosporin use.   Penicillins    Medications:  Current Outpatient Medications:    doxycycline (VIBRA-TABS) 100 MG tablet, Take 1 tablet  (100 mg total) by mouth 2 (two) times daily., Disp: 20 tablet, Rfl: 0  Observations/Objective: Patient is well-developed, well-nourished in no acute distress.  Resting comfortably  at home.  Head is normocephalic, atraumatic.  No labored breathing.  Speech is clear and coherent with logical content.  Patient is alert and oriented at baseline.  Nasal congestion  Pain with palpation over sinuses   Assessment and Plan: 1. Acute sinusitis, recurrence not specified, unspecified location - doxycycline (VIBRA-TABS) 100 MG tablet; Take 1 tablet (100 mg total) by mouth 2 (two) times daily.  Dispense: 20 tablet; Refill: 0  - Take meds as prescribed - Use a cool mist humidifier  -Use saline nose sprays frequently -Force fluids -For any cough or congestion  Use plain Mucinex- regular strength or max strength is fine -For fever or aces or pains- take tylenol or ibuprofen. -Throat lozenges if help -Follow up if symptoms worsen or do not improve    Follow Up Instructions: I discussed the assessment and treatment plan with the patient. The patient was provided an opportunity to ask questions and all were answered. The patient agreed with the plan and demonstrated an understanding of the instructions.  A copy of instructions were sent to the patient via MyChart unless otherwise noted below.    The patient was advised to call back or seek an in-person evaluation if the symptoms worsen or if the condition fails to improve as anticipated.  Time:  I spent 9 minutes with the patient via telehealth technology discussing the above problems/concerns.    Evelina Dun, FNP

## 2022-04-14 ENCOUNTER — Emergency Department (HOSPITAL_COMMUNITY): Payer: Medicaid Other

## 2022-04-14 ENCOUNTER — Other Ambulatory Visit: Payer: Self-pay

## 2022-04-14 ENCOUNTER — Encounter (HOSPITAL_COMMUNITY): Payer: Self-pay | Admitting: Emergency Medicine

## 2022-04-14 ENCOUNTER — Emergency Department (HOSPITAL_COMMUNITY)
Admission: EM | Admit: 2022-04-14 | Discharge: 2022-04-15 | Discharge status: Against Medical Advice | Payer: Medicaid Other | Attending: Emergency Medicine | Admitting: Emergency Medicine

## 2022-04-14 DIAGNOSIS — R0781 Pleurodynia: Secondary | ICD-10-CM | POA: Insufficient documentation

## 2022-04-14 DIAGNOSIS — Z5321 Procedure and treatment not carried out due to patient leaving prior to being seen by health care provider: Secondary | ICD-10-CM | POA: Diagnosis not present

## 2022-04-14 LAB — CBC
HCT: 45.2 % (ref 39.0–52.0)
Hemoglobin: 15.4 g/dL (ref 13.0–17.0)
MCH: 31 pg (ref 26.0–34.0)
MCHC: 34.1 g/dL (ref 30.0–36.0)
MCV: 90.9 fL (ref 80.0–100.0)
Platelets: 460 10*3/uL — ABNORMAL HIGH (ref 150–400)
RBC: 4.97 MIL/uL (ref 4.22–5.81)
RDW: 13 % (ref 11.5–15.5)
WBC: 7.6 10*3/uL (ref 4.0–10.5)
nRBC: 0 % (ref 0.0–0.2)

## 2022-04-14 LAB — COMPREHENSIVE METABOLIC PANEL
ALT: 17 U/L (ref 0–44)
AST: 18 U/L (ref 15–41)
Albumin: 4 g/dL (ref 3.5–5.0)
Alkaline Phosphatase: 68 U/L (ref 38–126)
Anion gap: 11 (ref 5–15)
BUN: 13 mg/dL (ref 6–20)
CO2: 24 mmol/L (ref 22–32)
Calcium: 8.9 mg/dL (ref 8.9–10.3)
Chloride: 102 mmol/L (ref 98–111)
Creatinine, Ser: 0.9 mg/dL (ref 0.61–1.24)
GFR, Estimated: >60 mL/min (ref >60)
Glucose, Bld: 120 mg/dL — ABNORMAL HIGH (ref 70–99)
Potassium: 4.2 mmol/L (ref 3.5–5.1)
Sodium: 137 mmol/L (ref 135–145)
Total Bilirubin: 0.4 mg/dL (ref 0.3–1.2)
Total Protein: 7.2 g/dL (ref 6.5–8.1)

## 2022-04-14 LAB — TROPONIN I (HIGH SENSITIVITY): Troponin I (High Sensitivity): <2 ng/L (ref <18)

## 2022-04-14 LAB — LIPASE, BLOOD: Lipase: 47 U/L (ref 11–51)

## 2022-04-14 NOTE — ED Triage Notes (Signed)
Pt states heavy lifting last night while at work when he began to experience left sided rib pain the radiates to the mid back. Pt states pain with taking in a deep breath.

## 2022-04-14 NOTE — ED Provider Triage Note (Signed)
Emergency Medicine Provider Triage Evaluation Note  Arish Redner , a 22 y.o. male  was evaluated in triage.  Pt complains of L sided rib/chest pain that started yesterday night around 10pm. States he does do heavy lifting every day at work but states he  was not lifting or moving when the pain began.  Pain is constant but waxes and wanes.  No recent surgeries, hospitalization, long travel, hemoptysis, estrogen containing OCP, cancer history.  No unilateral leg swelling.  No history of PE or VTE.   Review of Systems  Positive: As above Negative: Fever   Physical Exam  BP 123/80   Pulse 80   Temp 98.8 F (37.1 C) (Oral)   Resp 16   Ht '5\' 7"'$  (1.702 m)   Wt 68 kg   SpO2 99%   BMI 23.49 kg/m  Gen:   Awake, no distress   Resp:  Normal effort  MSK:   Moves extremities without difficulty  Other:    Medical Decision Making  Medically screening exam initiated at 10:42 PM.  Appropriate orders placed.  [name] was informed that the remainder of the evaluation will be completed by another provider, this initial triage assessment does not replace that evaluation, and the importance of remaining in the ED until their evaluation is complete.  Labs, EKG CXR   Pati Gallo [place], [place] 04/14/22 2244

## 2022-04-15 LAB — TROPONIN I (HIGH SENSITIVITY): Troponin I (High Sensitivity): <2 ng/L (ref <18)

## 2022-04-15 NOTE — ED Notes (Signed)
Pt called x3 with no answer, taken off the floor.

## 2022-04-15 NOTE — ED Notes (Signed)
Pt called,no answer.

## 2022-08-24 ENCOUNTER — Emergency Department (HOSPITAL_COMMUNITY): Payer: Medicaid Other

## 2022-08-24 ENCOUNTER — Other Ambulatory Visit: Payer: Self-pay

## 2022-08-24 ENCOUNTER — Emergency Department (HOSPITAL_COMMUNITY)
Admission: EM | Admit: 2022-08-24 | Discharge: 2022-08-25 | Disposition: A | Discharge status: Home / Self Care | Payer: Medicaid Other | Attending: Nurse Practitioner | Admitting: Nurse Practitioner

## 2022-08-24 ENCOUNTER — Encounter (HOSPITAL_COMMUNITY): Payer: Self-pay | Admitting: Emergency Medicine

## 2022-08-24 DIAGNOSIS — R0789 Other chest pain: Secondary | ICD-10-CM | POA: Diagnosis not present

## 2022-08-24 DIAGNOSIS — R5381 Other malaise: Secondary | ICD-10-CM | POA: Insufficient documentation

## 2022-08-24 DIAGNOSIS — R0602 Shortness of breath: Secondary | ICD-10-CM | POA: Diagnosis present

## 2022-08-24 DIAGNOSIS — L237 Allergic contact dermatitis due to plants, except food: Secondary | ICD-10-CM | POA: Insufficient documentation

## 2022-08-24 DIAGNOSIS — L255 Unspecified contact dermatitis due to plants, except food: Secondary | ICD-10-CM

## 2022-08-24 NOTE — ED Provider Triage Note (Signed)
Emergency Medicine Provider Triage Evaluation Note  Jorge Santos , a 22 y.o. male  was evaluated in triage.  Pt complains of rash with shortness of breath.  Patient states he was working outside 4 days ago on the [place] when he began noticing a rash on his arms and later they were burning different kinds of weeds which he believes he breathed in that had possibly poison sumac or oak ivy and it.  Patient states he has severe allergy to the last time he ended up on a ventilator.  Patient states he feels he cannot take a full deep breath.  Patient denied chest pain, fevers, vision changes, headache, neck pain  Review of Systems  Positive: See HPI Negative: See HPI  Physical Exam  BP (!) 141/94 (BP Location: Left Arm)   Pulse 93   Temp 99 F (37.2 C) (Oral)   Resp 20   SpO2 98%  Gen:   Awake, no distress   Resp:  Normal effort  MSK:   Moves extremities without difficulty  Other:  Lungs clear to auscultation bilaterally, rash noted across torso/face  Medical Decision Making  Medically screening exam initiated at 11:38 PM.  Appropriate orders placed.  Jorge Santos was informed that the remainder of the evaluation will be completed by another provider, this initial triage assessment does not replace that evaluation, and the importance of remaining in the ED until their evaluation is complete.  Workup initiated, patient stable at this time   Jorge Santos 08/24/22 2352
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
## 2022-08-25 LAB — BASIC METABOLIC PANEL
Anion gap: 11 (ref 5–15)
BUN: 12 mg/dL (ref 6–20)
CO2: 25 mmol/L (ref 22–32)
Calcium: 9.1 mg/dL (ref 8.9–10.3)
Chloride: 102 mmol/L (ref 98–111)
Creatinine, Ser: 0.88 mg/dL (ref 0.61–1.24)
GFR, Estimated: >60 mL/min (ref >60)
Glucose, Bld: 99 mg/dL (ref 70–99)
Potassium: 3.8 mmol/L (ref 3.5–5.1)
Sodium: 138 mmol/L (ref 135–145)

## 2022-08-25 LAB — CBC WITH DIFFERENTIAL/PLATELET
Abs Immature Granulocytes: 0.05 10*3/uL (ref 0.00–0.07)
Basophils Absolute: 0.1 10*3/uL (ref 0.0–0.1)
Basophils Relative: 1 %
Eosinophils Absolute: 0.3 10*3/uL (ref 0.0–0.5)
Eosinophils Relative: 3 %
HCT: 48.1 % (ref 39.0–52.0)
Hemoglobin: 16.7 g/dL (ref 13.0–17.0)
Immature Granulocytes: 1 %
Lymphocytes Relative: 21 %
Lymphs Abs: 2 10*3/uL (ref 0.7–4.0)
MCH: 31 pg (ref 26.0–34.0)
MCHC: 34.7 g/dL (ref 30.0–36.0)
MCV: 89.2 fL (ref 80.0–100.0)
Monocytes Absolute: 0.8 10*3/uL (ref 0.1–1.0)
Monocytes Relative: 8 %
Neutro Abs: 6.5 10*3/uL (ref 1.7–7.7)
Neutrophils Relative %: 66 %
Platelets: 459 10*3/uL — ABNORMAL HIGH (ref 150–400)
RBC: 5.39 MIL/uL (ref 4.22–5.81)
RDW: 12.7 % (ref 11.5–15.5)
WBC: 9.7 10*3/uL (ref 4.0–10.5)
nRBC: 0 % (ref 0.0–0.2)

## 2022-08-25 MED ORDER — HYDROXYZINE HCL 25 MG PO TABS
25.0000 mg | ORAL_TABLET | Freq: Once | ORAL | Status: AC
  Administered 2022-08-25: 25 mg via ORAL
  Filled 2022-08-25: qty 1

## 2022-08-25 MED ORDER — PREDNISONE 20 MG PO TABS
40.0000 mg | ORAL_TABLET | Freq: Once | ORAL | Status: AC
  Administered 2022-08-25: 40 mg via ORAL
  Filled 2022-08-25: qty 2

## 2022-08-25 MED ORDER — PREDNISONE 10 MG (21) PO TBPK: ORAL_TABLET | Freq: Every day | ORAL | 0 refills | Status: AC

## 2022-08-25 MED ORDER — ONDANSETRON 4 MG PO TBDP
4.0000 mg | ORAL_TABLET | Freq: Once | ORAL | Status: AC
  Administered 2022-08-25: 4 mg via ORAL
  Filled 2022-08-25: qty 1

## 2022-08-25 MED ORDER — ONDANSETRON 4 MG PO TBDP: 4.0000 mg | ORAL_TABLET | Freq: Three times a day (TID) | ORAL | 0 refills | Status: AC | PRN
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
## 2022-08-25 NOTE — ED Notes (Signed)
Pt ambulated from ed with steady gait. Pt verbalized understanding of discharge instuctions

## 2023-02-28 ENCOUNTER — Encounter (HOSPITAL_COMMUNITY): Payer: Self-pay

## 2023-02-28 ENCOUNTER — Other Ambulatory Visit: Payer: Self-pay

## 2023-02-28 ENCOUNTER — Emergency Department (HOSPITAL_COMMUNITY)
Admission: EM | Admit: 2023-02-28 | Discharge: 2023-02-28 | Disposition: A | Discharge status: Home / Self Care | Payer: 59 | Attending: Emergency Medicine | Admitting: Emergency Medicine

## 2023-02-28 DIAGNOSIS — M549 Dorsalgia, unspecified: Secondary | ICD-10-CM | POA: Diagnosis present

## 2023-02-28 DIAGNOSIS — M546 Pain in thoracic spine: Secondary | ICD-10-CM | POA: Insufficient documentation

## 2023-02-28 DIAGNOSIS — M940 Chondrocostal junction syndrome [Tietze]: Secondary | ICD-10-CM | POA: Diagnosis not present

## 2023-02-28 MED ORDER — CYCLOBENZAPRINE HCL 10 MG PO TABS: 10.0000 mg | ORAL_TABLET | Freq: Two times a day (BID) | ORAL | 0 refills | Status: AC | PRN

## 2023-02-28 MED ORDER — IBUPROFEN 600 MG PO TABS: 600.0000 mg | ORAL_TABLET | Freq: Four times a day (QID) | ORAL | 0 refills | Status: AC | PRN

## 2023-02-28 MED ORDER — IBUPROFEN 800 MG PO TABS
800.0000 mg | ORAL_TABLET | Freq: Once | ORAL | Status: AC
  Administered 2023-02-28: 800 mg via ORAL
  Filled 2023-02-28: qty 1
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
## 2023-07-14 ENCOUNTER — Other Ambulatory Visit: Payer: Self-pay

## 2023-07-14 ENCOUNTER — Emergency Department (HOSPITAL_BASED_OUTPATIENT_CLINIC_OR_DEPARTMENT_OTHER)
Admission: EM | Admit: 2023-07-14 | Discharge: 2023-07-14 | Disposition: A | Discharge status: Home / Self Care | Attending: Emergency Medicine | Admitting: Emergency Medicine

## 2023-07-14 DIAGNOSIS — H6123 Impacted cerumen, bilateral: Secondary | ICD-10-CM | POA: Insufficient documentation

## 2023-07-14 DIAGNOSIS — H9193 Unspecified hearing loss, bilateral: Secondary | ICD-10-CM | POA: Diagnosis present

## 2023-07-14 MED ORDER — CARBAMIDE PEROXIDE 6.5 % OT SOLN: 10.0000 [drp] | Freq: Once | OTIC | Status: DC

## 2023-07-14 MED ORDER — DOCUSATE SODIUM 50 MG/5ML PO LIQD
10.0000 mg | ORAL | Status: AC
  Administered 2023-07-14: 10 mg via OTIC
  Filled 2023-07-14: qty 10

## 2023-07-14 MED ORDER — OFLOXACIN 0.3 % OT SOLN: 5.0000 [drp] | Freq: Every day | OTIC | 0 refills | Status: AC

## 2023-07-14 NOTE — ED Notes (Signed)

## 2023-07-14 NOTE — Discharge Instructions (Addendum)
 You had irrigation done of both of your ears.  There is some residual inflammation.  I have sent antibiotic eardrops sent to the pharmacy for you.  Establish a primary care provider.  Clinic listed above.  You could use the Debrox over-the-counter earwax removal kit that you purchased to clean your ears about once every week or other week.

## 2023-07-14 NOTE — ED Provider Notes (Signed)
 [place] [facility] Provider Note   CSN: [id number] Arrival date & time: 07/14/23  1930     History  Chief Complaint  Patient presents with   Ear Fullness    Jorge Santos is a 23 y.o. male.  23 year old male presents today for concern of decreased hearing in both of his ears.  States he feels like his ears are blocked.  Denies any other concerns.  No fever.  The history is provided by the patient. No language interpreter was used.       Home Medications Prior to Admission medications   Medication Sig Start Date End Date Taking? Authorizing Provider  cyclobenzaprine (FLEXERIL) 10 MG tablet Take 1 tablet (10 mg total) by mouth 2 (two) times daily as needed for muscle spasms. 02/28/23   Fayrene Helper, PA-C  doxycycline (VIBRA-TABS) 100 MG tablet Take 1 tablet (100 mg total) by mouth 2 (two) times daily. Patient not taking: Reported on 04/14/2022 08/31/21   Jannifer Rodney A, FNP  ibuprofen (ADVIL) 600 MG tablet Take 1 tablet (600 mg total) by mouth every 6 (six) hours as needed. 02/28/23   Fayrene Helper, PA-C  ondansetron (ZOFRAN-ODT) 4 MG disintegrating tablet Take 1 tablet (4 mg total) by mouth every 8 (eight) hours as needed for nausea or vomiting. 08/25/22   Tilden Fossa, MD  predniSONE (STERAPRED UNI-PAK 21 TAB) 10 MG (21) TBPK tablet Take by mouth daily. Take 6 tabs by mouth daily  for 2 days, then 5 tabs for 2 days, then 4 tabs for 2 days, then 3 tabs for 2 days, 2 tabs for 2 days, then 1 tab by mouth daily for 2 days 08/25/22   Tilden Fossa, MD      Allergies    Penicillins, Penicillins, Poison ivy extract, and Poison oak extract    Review of Systems   Review of Systems  Constitutional:  Negative for chills and fever.  HENT:  Positive for ear pain.   All other systems reviewed and are negative.   Physical Exam Updated Vital Signs BP (!) 140/98 (BP Location: Left Arm)   Pulse 79   Temp 98.1 F (36.7 C) (Oral)   Resp 18   Ht 5'  6" (1.676 m)   Wt 72.6 kg   SpO2 97%   BMI 25.83 kg/m  Physical Exam Vitals and nursing note reviewed.  Constitutional:      General: He is not in acute distress.    Appearance: Normal appearance. He is not ill-appearing.  HENT:     Head: Normocephalic and atraumatic.     Right Ear: There is impacted cerumen.     Left Ear: There is impacted cerumen.     Nose: Nose normal.  Eyes:     Conjunctiva/sclera: Conjunctivae normal.  Cardiovascular:     Rate and Rhythm: Normal rate and regular rhythm.  Pulmonary:     Effort: Pulmonary effort is normal. No respiratory distress.  Musculoskeletal:        General: No deformity.  Skin:    Findings: No rash.  Neurological:     Mental Status: He is alert.     ED Results / Procedures / Treatments   Labs (all labs ordered are listed, but only abnormal results are displayed) Labs Reviewed - No data to display  EKG None  Radiology No results found.  Procedures Procedures    Medications Ordered in ED Medications  docusate (COLACE) 50 MG/5ML liquid 10 mg (10 mg Both EARS Given  07/14/23 2153)    ED Course/ Medical Decision Making/ A&P                                 Medical Decision Making Risk OTC drugs.   23 year old male presents today for concern of cerumen impaction bilaterally.  Confirmed on exam. Irrigation performed.  Cerumen impaction resolved. Residual inflammation noted in the ear canal.  TM appears normal. Ofloxacin prescribed. Discharged in stable condition.  PCP referral given.   Final Clinical Impression(s) / ED Diagnoses Final diagnoses:  Bilateral impacted cerumen    Rx / DC Orders ED Discharge Orders          Ordered    ofloxacin (FLOXIN) 0.3 % OTIC solution  Daily        07/14/23 2234              Marita [place], PA-C 07/14/23 2236    Linwood Dibbles, MD 07/15/23 [phone]

## 2023-07-14 NOTE — ED Triage Notes (Signed)
 Pt. Reports 5 days ago woke up and couldn't hear out of left ear. Tried using home remedy and wax removal to clean out ear but nothing came out. States it hurts sometimes
